# Patient Record
Sex: Female | Born: 2000 | Race: Black or African American | Hispanic: No | Marital: Single | Smoking: Former smoker
Health system: Southern US, Community
[De-identification: ages and names within clinical notes are randomized; demographics above are authoritative.]

## PROBLEM LIST (undated history)

## (undated) DIAGNOSIS — F419 Anxiety disorder, unspecified: Secondary | ICD-10-CM

## (undated) DIAGNOSIS — M5126 Other intervertebral disc displacement, lumbar region: Secondary | ICD-10-CM

## (undated) DIAGNOSIS — I10 Essential (primary) hypertension: Secondary | ICD-10-CM

## (undated) HISTORY — PX: MOUTH SURGERY: SHX715

## (undated) HISTORY — PX: NO PAST SURGERIES: SHX2092

## (undated) HISTORY — DX: Essential (primary) hypertension: I10

## (undated) HISTORY — DX: Anxiety disorder, unspecified: F41.9

---
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
## 2019-05-27 ENCOUNTER — Ambulatory Visit: Payer: Managed Care, Other (non HMO) | Attending: Internal Medicine

## 2019-05-27 DIAGNOSIS — Z20822 Contact with and (suspected) exposure to covid-19: Secondary | ICD-10-CM

## 2019-05-29 LAB — NOVEL CORONAVIRUS, NAA: SARS-CoV-2, NAA: NOT DETECTED

## 2019-05-31 DIAGNOSIS — A6000 Herpesviral infection of urogenital system, unspecified: Secondary | ICD-10-CM

## 2019-05-31 HISTORY — DX: Herpesviral infection of urogenital system, unspecified: A60.00

## 2019-06-11 ENCOUNTER — Ambulatory Visit: Payer: Managed Care, Other (non HMO) | Attending: Internal Medicine

## 2019-06-11 DIAGNOSIS — Z20822 Contact with and (suspected) exposure to covid-19: Secondary | ICD-10-CM

## 2019-06-12 ENCOUNTER — Telehealth: Payer: Self-pay

## 2019-06-12 NOTE — Telephone Encounter (Signed)
Call placed to pt.  Advised that an incorrect order was placed for her COVID test when she was to the [facility] on 1/12. (an order was incorrectly placed for "MODY Genetic Profile")  The patient stated she has to show that she has a negative COVID test, before she can return to campus on Saturday, and voiced concern about getting results in time.  Advised will contact [facility] to inquire if a correct order can be placed, to prevent the pt. from having to retest.   Phone call to [employer].  Spoke with Lupita Leash. Was advised that the correct order can be entered by [facility], and the specimen can be processed.  Advised that the pt. Will not need to retest.  Per [employer], an authorization form will be sent for a provider to sign, which will allow [facility] to finalize the specimen and release results.    Phone call to Dr. Ria Clock.  She stated she is willing to sign the authorization form, to prevent the patient from having to retest.  Advised Dr. Dayton Scrape that the form will be emailed to her.  Dr. Dayton Scrape agreed with plan.  Phone call to pt.  Advised she will not need to retest.  Advised of plan to have the correct order placed and to proceed with processing her COVID specimen.  Pt. Verb. Understanding.

## 2019-06-12 NOTE — Telephone Encounter (Signed)
Pt called and stated that she has results in her my chart that are not correct. Pt went for a covid test on 06/11/19. Pt states that she would like a call back regarding. Should pt go and get another test or can [employer] fix this. Please advise

## 2019-06-13 ENCOUNTER — Other Ambulatory Visit: Payer: Managed Care, Other (non HMO)

## 2019-06-13 LAB — MATURITY ONSET DIABETES OF THE YOUNG(MODY)GENETIC PROFILE

## 2019-06-13 LAB — SPECIMEN STATUS REPORT

## 2019-06-13 LAB — NOVEL CORONAVIRUS, NAA: SARS-CoV-2, NAA: NOT DETECTED

## 2019-10-07 ENCOUNTER — Ambulatory Visit
Admission: EM | Admit: 2019-10-07 | Discharge: 2019-10-07 | Disposition: A | Discharge status: Home / Self Care | Payer: Managed Care, Other (non HMO) | Attending: Family Medicine | Admitting: Family Medicine

## 2019-10-07 ENCOUNTER — Other Ambulatory Visit: Payer: Self-pay

## 2019-10-07 ENCOUNTER — Encounter: Payer: Self-pay | Admitting: Emergency Medicine

## 2019-10-07 DIAGNOSIS — Z789 Other specified health status: Secondary | ICD-10-CM

## 2019-10-07 DIAGNOSIS — R238 Other skin changes: Secondary | ICD-10-CM

## 2019-10-07 MED ORDER — MUPIROCIN 2 % EX OINT: TOPICAL_OINTMENT | CUTANEOUS | 0 refills | Status: DC

## 2019-10-07 MED ORDER — SULFAMETHOXAZOLE-TRIMETHOPRIM 800-160 MG PO TABS: 1.0000 | ORAL_TABLET | Freq: Two times a day (BID) | ORAL | 0 refills | Status: AC

## 2019-10-07 NOTE — Discharge Instructions (Signed)
Keep clean as discussed.  Monitor.  Use topical ointment.  If you notice any redness, swelling, pain or continued irritation begin oral antibiotic.  Follow up with your primary care physician this week as needed. Return to Urgent care for new or worsening concerns.

## 2019-10-07 NOTE — ED Provider Notes (Signed)
[facility] ____________________________________________  Time seen: Approximately 9:09 AM  I have reviewed the triage vital signs and the nursing notes.   HISTORY  Chief Complaint Skin Problem  HPI Kathy Moran is a 19 y.o. female presenting for evaluation of possible infection to bellybutton piercing.  Patient has had piercing present since August 2020.  Patient states last few days she noticed some irritation at the top piercing hole and concerned that potentially may be infected.  Denies pain.  Denies any injury or snagging it.  Tetanus immunizations up-to-date.  Denies fevers, abdominal pain, dysuria, back pain.  Denies pregnancy.  Reports otherwise doing well.   Past Medical History:  Diagnosis Date  . Lumbar herniated disc     There are no problems to display for this patient.   Past Surgical History:  Procedure Laterality Date  . NO PAST SURGERIES       No current facility-administered medications for this encounter.  Current Outpatient Medications:  .  TRI-PREVIFEM 0.18/0.215/0.25 MG-35 MCG tablet, TAKE 1 TAB BY MOUTH ONCE A DAY FOR 28 DAYS MAX. DAILY DOSE: 1 TAB, Disp: , Rfl: 5 .  mupirocin ointment (BACTROBAN) 2 %, Apply two times a day for 7 days., Disp: 22 g, Rfl: 0 .  sulfamethoxazole-trimethoprim (BACTRIM DS) 800-160 MG tablet, Take 1 tablet by mouth 2 (two) times daily for 7 days., Disp: 14 tablet, Rfl: 0  Allergies Shrimp [shellfish allergy]  Family History  Problem Relation Age of Onset  . Healthy Mother   . Healthy Father     Social History Social History   Tobacco Use  . Smoking status: Never Smoker  . Smokeless tobacco: Never Used  Substance Use Topics  . Alcohol use: Yes  . Drug use: Never    Review of Systems Constitutional: No fever Cardiovascular: Denies chest pain. Respiratory: Denies shortness of breath. Gastrointestinal: No abdominal pain.  No nausea, no vomiting.  No diarrhea.   Genitourinary: Negative for  dysuria. Musculoskeletal: Negative for back pain. Skin: Positive skin changes.  ____________________________________________   PHYSICAL EXAM:  VITAL SIGNS: ED Triage Vitals  Enc Vitals Group     BP 10/07/19 0900 131/87     Pulse Rate 10/07/19 0900 99     Resp 10/07/19 0900 18     Temp 10/07/19 0900 98.2 F (36.8 C)     Temp Source 10/07/19 0900 Oral     SpO2 10/07/19 0900 100 %     Weight 10/07/19 0857 143 lb 1.3 oz (64.9 kg)     Height 10/07/19 0857 5\' 6"  (1.676 m)     Head Circumference --      Peak Flow --      Pain Score 10/07/19 0857 1     Pain Loc --      Pain Edu? --      Excl. in [place]? --     Constitutional: Alert and oriented. Well appearing and in no acute distress. Eyes: Conjunctivae are normal.  ENT      Head: Normocephalic and atraumatic. Respiratory: Normal respiratory effort without tachypnea nor retractions. Gastrointestinal: Soft and nontender.  No CVA tenderness. Musculoskeletal: Steady gait.  Neurologic:  Normal speech and language. Speech is normal. No gait instability.  Skin:  Skin is warm, dry.  Except: Navel piercing present with with piercing still present, along the top of the piercing minimal dried blood with minimal purulence, no active drainage, no erythema surrounding, no induration, nontender. Psychiatric: Mood and affect are normal. Speech and behavior are normal.  Patient exhibits appropriate insight and judgment   ___________________________________________   LABS (all labs ordered are listed, but only abnormal results are displayed)  Labs Reviewed - No data to display ____________________________________________   PROCEDURES Procedures   INITIAL IMPRESSION / ASSESSMENT AND PLAN / ED COURSE  Pertinent labs & imaging results that were available during my care of the patient were reviewed by me and considered in my medical decision making (see chart for details).  Well-appearing patient.  Bellybutton piercing appears more  irritated, concern for an beginning infection.  Will treat with topical Bactroban.  Hardcopy Bactrim given to begin in the next 2 to 3 days if any redness begins or if does not resolve.  Discussed very strict follow-up and return parameters.  Counseled cleaning.Discussed indication, risks and benefits of medications with patient.   Discussed follow up with Primary care physician this week as needed. Discussed follow up and return parameters including no resolution or any worsening concerns. Patient verbalized understanding and agreed to plan.   ____________________________________________   FINAL CLINICAL IMPRESSION(S) / ED DIAGNOSES  Final diagnoses:  Skin irritation  Body piercing     ED Discharge Orders         Ordered    mupirocin ointment (BACTROBAN) 2 %     10/07/19 0913    sulfamethoxazole-trimethoprim (BACTRIM DS) 800-160 MG tablet  2 times daily     10/07/19 0913           Note: This dictation was prepared with Dragon dictation along with smaller phrase technology. Any transcriptional errors that result from this process are unintentional.         Renford Dills, NP 10/07/19 1038

## 2019-10-07 NOTE — ED Triage Notes (Signed)
Pt c/o belly button pain. She states that her piercing is infected. She had it pierced in August 2020. noticed the pain and discharge from the area last night.

## 2020-06-02 ENCOUNTER — Encounter: Payer: Managed Care, Other (non HMO) | Admitting: Certified Nurse Midwife

## 2020-07-13 ENCOUNTER — Other Ambulatory Visit: Payer: Self-pay

## 2020-07-13 ENCOUNTER — Encounter: Payer: Self-pay | Admitting: Certified Nurse Midwife

## 2020-07-13 ENCOUNTER — Ambulatory Visit (INDEPENDENT_AMBULATORY_CARE_PROVIDER_SITE_OTHER): Payer: Managed Care, Other (non HMO) | Admitting: Certified Nurse Midwife

## 2020-07-13 VITALS — BP 125/96 | HR 112 | Resp 16 | Ht 66.0 in | Wt 146.1 lb

## 2020-07-13 DIAGNOSIS — Z01419 Encounter for gynecological examination (general) (routine) without abnormal findings: Secondary | ICD-10-CM | POA: Diagnosis not present

## 2020-07-13 NOTE — Patient Instructions (Signed)
Preventive Care 21-21 Years Old, Female Preventive care refers to lifestyle choices and visits with your health care provider that can promote health and wellness. This includes:  A yearly physical exam. This is also called an annual wellness visit.  Regular dental and eye exams.  Immunizations.  Screening for certain conditions.  Healthy lifestyle choices, such as: ? Eating a healthy diet. ? Getting regular exercise. ? Not using drugs or products that contain nicotine and tobacco. ? Limiting alcohol use. What can I expect for my preventive care visit? Physical exam Your health care provider may check your:  Height and weight. These may be used to calculate your BMI (body mass index). BMI is a measurement that tells if you are at a healthy weight.  Heart rate and blood pressure.  Body temperature.  Skin for abnormal spots. Counseling Your health care provider may ask you questions about your:  Past medical problems.  Family's medical history.  Alcohol, tobacco, and drug use.  Emotional well-being.  Home life and relationship well-being.  Sexual activity.  Diet, exercise, and sleep habits.  Work and work environment.  Access to firearms.  Method of birth control.  Menstrual cycle.  Pregnancy history. What immunizations do I need? Vaccines are usually given at various ages, according to a schedule. Your health care provider will recommend vaccines for you based on your age, medical history, and lifestyle or other factors, such as travel or where you work.   What tests do I need? Blood tests  Lipid and cholesterol levels. These may be checked every 5 years starting at age 20.  Hepatitis C test.  Hepatitis B test. Screening  Diabetes screening. This is done by checking your blood sugar (glucose) after you have not eaten for a while (fasting).  STD (sexually transmitted disease) testing, if you are at risk.  BRCA-related cancer screening. This may be  done if you have a family history of breast, ovarian, tubal, or peritoneal cancers.  Pelvic exam and Pap test. This may be done every 3 years starting at age 21. Starting at age 30, this may be done every 5 years if you have a Pap test in combination with an HPV test. Talk with your health care provider about your test results, treatment options, and if necessary, the need for more tests.   Follow these instructions at home: Eating and drinking  Eat a healthy diet that includes fresh fruits and vegetables, whole grains, lean protein, and low-fat dairy products.  Take vitamin and mineral supplements as recommended by your health care provider.  Do not drink alcohol if: ? Your health care provider tells you not to drink. ? You are pregnant, may be pregnant, or are planning to become pregnant.  If you drink alcohol: ? Limit how much you have to 0-1 drink a day. ? Be aware of how much alcohol is in your drink. In the [place] one drink equals one 12 oz bottle of beer (355 mL), one 5 oz glass of wine (148 mL), or one 1 oz glass of hard liquor (44 mL).   Lifestyle  Take daily care of your teeth and gums. Brush your teeth every morning and night with fluoride toothpaste. Floss one time each day.  Stay active. Exercise for at least 30 minutes 5 or more days each week.  Do not use any products that contain nicotine or tobacco, such as cigarettes, e-cigarettes, and chewing tobacco. If you need help quitting, ask your health care provider.  Do not   use drugs.  If you are sexually active, practice safe sex. Use a condom or other form of protection to prevent STIs (sexually transmitted infections).  If you do not wish to become pregnant, use a form of birth control. If you plan to become pregnant, see your health care provider for a prepregnancy visit.  Find healthy ways to cope with stress, such as: ? Meditation, yoga, or listening to music. ? Journaling. ? Talking to a trusted  person. ? Spending time with friends and family. Safety  Always wear your seat belt while driving or riding in a vehicle.  Do not drive: ? If you have been drinking alcohol. Do not ride with someone who has been drinking. ? When you are tired or distracted. ? While texting.  Wear a helmet and other protective equipment during sports activities.  If you have firearms in your house, make sure you follow all gun safety procedures.  Seek help if you have been physically or sexually abused. What's next?  Go to your health care provider once a year for an annual wellness visit.  Ask your health care provider how often you should have your eyes and teeth checked.  Stay up to date on all vaccines. This information is not intended to replace advice given to you by your health care provider. Make sure you discuss any questions you have with your health care provider. Document Revised: 01/12/2020 Document Reviewed: 01/25/2018 Elsevier Patient Education  2021 Elsevier Inc.  

## 2020-07-13 NOTE — Progress Notes (Addendum)
GYNECOLOGY ANNUAL PREVENTATIVE CARE ENCOUNTER NOTE  History:     Kathy Moran is a 20 y.o. No obstetric history on file. female here for a routine annual gynecologic exam.  Current complaints: none. Was told by her pediatrician that she needs to start seeing GYN.   Denies abnormal vaginal bleeding, discharge, pelvic pain, problems with intercourse or other gynecologic concerns.     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 Contraception: OCP (estrogen/progesterone) Last Pap: not due until 20 Last mammogram: n/a   The pregnancy intention screening data noted above was reviewed. Potential methods of contraception were discussed. The patient elected to proceed with Oral Contraceptive.    Obstetric History OB History  No obstetric history on file.    Past Medical History:  Diagnosis Date  . Lumbar herniated disc   . HSV   Past Surgical History:  Procedure Laterality Date  . NO PAST SURGERIES      Current Outpatient Medications on File Prior to Visit  Medication Sig Dispense Refill  . levonorgestrel-ethinyl estradiol (ALESSE) 0.1-20 MG-MCG tablet Take 1 tablet by mouth daily.    . mupirocin ointment (BACTROBAN) 2 % Apply two times a day for 7 days. 22 g 0  . TRI-PREVIFEM 0.18/0.215/0.25 MG-35 MCG tablet TAKE 1 TAB BY MOUTH ONCE A DAY FOR 28 DAYS MAX. DAILY DOSE: 1 TAB  5   No current facility-administered medications on file prior to visit.    Allergies  Allergen Reactions  . Shrimp [Shellfish Allergy]     Social History:  reports that she has never smoked. She has never used smokeless tobacco. She reports current alcohol use. She reports that she does not use drugs.  Family History  Problem Relation Age of Onset  . Healthy Mother   . Healthy Father     The  following portions of the patient's history were reviewed and updated as appropriate: allergies, current medications, past family history, past medical history, past social history, past surgical history and problem list.  Review of Systems Pertinent items noted in HPI and remainder of comprehensive ROS otherwise negative.  Physical Exam:  BP (!) 137/101   Pulse (!) 112   Resp 16   Ht 5\' 6"  (1.676 m)   Wt 146 lb 1.6 oz (66.3 kg)   BMI 23.58 kg/m  CONSTITUTIONAL: Well-developed, well-nourished female in no acute distress.  HENT:  Normocephalic, atraumatic, External right and left ear normal. Oropharynx is clear and moist EYES: Conjunctivae and EOM are normal. Pupils are equal, round, and reactive to light. No scleral icterus.  NECK: Normal range of motion, supple, no masses.  Normal thyroid.  SKIN: Skin is warm and dry. No rash noted. Not diaphoretic. No erythema. No pallor. MUSCULOSKELETAL: Normal range of motion. No tenderness.  No cyanosis, clubbing, or edema.  2+ distal pulses. NEUROLOGIC: Alert and oriented to person, place, and time. Normal reflexes, muscle tone coordination.  PSYCHIATRIC: Normal mood and affect. Normal behavior. Normal judgment and thought content. CARDIOVASCULAR: Normal heart rate noted, regular rhythm RESPIRATORY: Clear to auscultation bilaterally. Effort and breath sounds normal, no problems with respiration noted. BREASTS: declines  ABDOMEN: Soft, no distention noted.  No tenderness, rebound or guarding.  PELVIC: Not indicated .   Assessment and Plan:  Annual [facility] Women GYN  due next year at annual ( pt given option to do today  or wait until next year given that she turns 20 in August).  Mammogram :n/a  Labs:n/a  Refills:none Referral:none Encouraged safe sex. Discussed HSV and shedding of virus, prevention of spread. Pt state she has had STD testing in the past.  BP elevated, pt state she has always had elevated BP's and that it runs in her  family , rpt 125/96. Encouraged pt to discussed with PCP. She verbalizes and agrees to plan of care.  Routine preventative health maintenance measures emphasized. Please refer to After Visit Summary for other counseling recommendations.      Doreene Burke, CNM [facility],  [facility]

## 2020-09-01 ENCOUNTER — Ambulatory Visit (INDEPENDENT_AMBULATORY_CARE_PROVIDER_SITE_OTHER): Payer: Managed Care, Other (non HMO)

## 2020-09-01 ENCOUNTER — Other Ambulatory Visit: Payer: Self-pay

## 2020-09-01 ENCOUNTER — Ambulatory Visit (HOSPITAL_COMMUNITY)
Admission: EM | Admit: 2020-09-01 | Discharge: 2020-09-01 | Disposition: A | Discharge status: Home / Self Care | Payer: Managed Care, Other (non HMO) | Attending: Emergency Medicine | Admitting: Emergency Medicine

## 2020-09-01 ENCOUNTER — Encounter (HOSPITAL_COMMUNITY): Payer: Self-pay

## 2020-09-01 DIAGNOSIS — M545 Low back pain, unspecified: Secondary | ICD-10-CM | POA: Diagnosis not present

## 2020-09-01 DIAGNOSIS — M546 Pain in thoracic spine: Secondary | ICD-10-CM

## 2020-09-01 DIAGNOSIS — M4186 Other forms of scoliosis, lumbar region: Secondary | ICD-10-CM | POA: Diagnosis not present

## 2020-09-01 MED ORDER — NAPROXEN 500 MG PO TABS: 500.0000 mg | ORAL_TABLET | Freq: Two times a day (BID) | ORAL | 0 refills | Status: DC

## 2020-09-01 MED ORDER — CYCLOBENZAPRINE HCL 10 MG PO TABS: 10.0000 mg | ORAL_TABLET | Freq: Every day | ORAL | 0 refills | Status: DC

## 2020-09-01 NOTE — Discharge Instructions (Signed)
Take naproxen twice a day for the next 5 days then as needed  Take muscle relaxer at bedtime as needed to help you rest, if making you too drowsy, take half a pill  Follow up with orthopedics if pain still persisting after 1 week

## 2020-09-01 NOTE — ED Triage Notes (Signed)
Pt c/o mid back pain x 4 days. The pt states the pain moves all around the rest of her back. Pt states she has taken Tizanidine and has not had relief.

## 2020-09-01 NOTE — ED Provider Notes (Signed)
[facility]    CSN: [id number] Arrival date & time: 09/01/20  1101      History   Chief Complaint Chief Complaint  Patient presents with  . Back Pain    HPI Kathy Moran is a 20 y.o. female.   Patient presents with left sided mid back pain radiating into middle of back starting 4 days ago. Worsened with twisting, turning, bending, standing and walking. Interfering with sleep. Denies numbness, tingling, urinary changes, precipitating event. Attempted use of ibuprofen and zanaflex with relief. History of herniated disk in the lumbar region, last flair up 2019.   Past Medical History:  Diagnosis Date  . Lumbar herniated disc     There are no problems to display for this patient.   Past Surgical History:  Procedure Laterality Date  . NO PAST SURGERIES      OB History   No obstetric history on file.      Home Medications    Prior to Admission medications   Medication Sig Start Date End Date Taking? Authorizing Provider  levonorgestrel-ethinyl estradiol (ALESSE) 0.1-20 MG-MCG tablet Take 1 tablet by mouth daily.    [provider]  mupirocin ointment (BACTROBAN) 2 % Apply two times a day for 7 days. 10/07/19   Renford Dills, NP  TRI-PREVIFEM 0.18/0.215/0.25 MG-35 MCG tablet TAKE 1 TAB BY MOUTH ONCE A DAY FOR 28 DAYS MAX. DAILY DOSE: 1 TAB 10/30/17   [provider]    Family History Family History  Problem Relation Age of Onset  . Healthy Mother   . Healthy Father     Social History Social History   Tobacco Use  . Smoking status: Never Smoker  . Smokeless tobacco: Never Used  Vaping Use  . Vaping Use: Every day  Substance Use Topics  . Alcohol use: Yes  . Drug use: Never     Allergies   Shrimp [shellfish allergy]   Review of Systems Review of Systems  Constitutional: Negative.   Respiratory: Negative.   Cardiovascular: Negative.   Musculoskeletal: Positive for back pain. Negative for arthralgias, gait problem,  joint swelling, myalgias, neck pain and neck stiffness.  Skin: Negative.   Neurological: Negative.      Physical Exam Triage Vital Signs ED Triage Vitals  Enc Vitals Group     BP 09/01/20 1213 (!) 142/89     Pulse Rate 09/01/20 1213 98     Resp 09/01/20 1213 18     Temp 09/01/20 1213 98.7 F (37.1 C)     Temp Source 09/01/20 1213 Oral     SpO2 09/01/20 1213 100 %     Weight --      Height --      Head Circumference --      Peak Flow --      Pain Score 09/01/20 1211 6     Pain Loc --      Pain Edu? --      Excl. in [place]? --    No data found.  Updated Vital Signs BP (!) 142/89 (BP Location: Right Arm)   Pulse 98   Temp 98.7 F (37.1 C) (Oral)   Resp 18   LMP 08/06/2020 (Exact Date)   SpO2 100%   Visual Acuity Right Eye Distance:   Left Eye Distance:   Bilateral Distance:    Right Eye Near:   Left Eye Near:    Bilateral Near:     Physical Exam Constitutional:      Appearance: Normal appearance.  She is normal weight.  HENT:     Head: Normocephalic.  Eyes:     Extraocular Movements: Extraocular movements intact.  Pulmonary:     Effort: Pulmonary effort is normal.  Musculoskeletal:       Arms:     Cervical back: Normal and normal range of motion.     Thoracic back: No swelling, spasms or tenderness.     Lumbar back: Normal.       Back:     Comments: Unable to reproduce back pain, no tenderness over thoracic over lumbar region  Skin:    General: Skin is warm and dry.  Neurological:     General: No focal deficit present.     Mental Status: She is alert and oriented to person, place, and time. Mental status is at baseline.  Psychiatric:        Mood and Affect: Mood normal.        Behavior: Behavior normal.        Thought Content: Thought content normal.        Judgment: Judgment normal.      UC Treatments / Results  Labs (all labs ordered are listed, but only abnormal results are displayed) Labs Reviewed - No data to  display  EKG   Radiology No results found.  Procedures Procedures (including critical care time)  Medications Ordered in UC Medications - No data to display  Initial Impression / Assessment and Plan / UC Course  I have reviewed the triage vital signs and the nursing notes.  Pertinent labs & imaging results that were available during my care of the patient were reviewed by me and considered in my medical decision making (see chart for details).   1. Xray thoracic-negative 2.xray lumbar- levoscoliosis 3. Naproxen 500 mg bid 4. Flexeril 10 mg at bedtime prn 5. Follow up with ortho in 1 week if pain persist, given resources   Final Clinical Impressions(s) / UC Diagnoses   Final diagnoses:  None   Discharge Instructions   None    ED Prescriptions    None     PDMP not reviewed this encounter.   Valinda Hoar, NP 09/01/20 1409

## 2021-04-07 ENCOUNTER — Encounter: Payer: Managed Care, Other (non HMO) | Admitting: Certified Nurse Midwife

## 2021-04-07 ENCOUNTER — Telehealth: Payer: Self-pay | Admitting: Certified Nurse Midwife

## 2021-04-07 ENCOUNTER — Other Ambulatory Visit: Payer: Self-pay

## 2021-04-07 MED ORDER — LEVONORGESTREL-ETHINYL ESTRAD 0.1-20 MG-MCG PO TABS: 1.0000 | ORAL_TABLET | Freq: Every day | ORAL | 0 refills | Status: DC

## 2021-04-07 NOTE — Telephone Encounter (Signed)
Pt came in and stated that she had a sore throat and had been coughing since Saturday and wanted to see if she could get her birth control since see will be out within the next few days.  Pt would like to have a call back to let her know if this can be done.

## 2021-04-07 NOTE — Telephone Encounter (Signed)
Rx sent, pt aware 

## 2021-05-05 ENCOUNTER — Other Ambulatory Visit (HOSPITAL_COMMUNITY)
Admission: RE | Admit: 2021-05-05 | Discharge: 2021-05-05 | Disposition: A | Discharge status: Home / Self Care | Payer: Managed Care, Other (non HMO) | Source: Ambulatory Visit | Attending: Certified Nurse Midwife | Admitting: Certified Nurse Midwife

## 2021-05-05 ENCOUNTER — Encounter: Payer: Self-pay | Admitting: Certified Nurse Midwife

## 2021-05-05 ENCOUNTER — Ambulatory Visit (INDEPENDENT_AMBULATORY_CARE_PROVIDER_SITE_OTHER): Payer: Managed Care, Other (non HMO) | Admitting: Certified Nurse Midwife

## 2021-05-05 ENCOUNTER — Encounter: Payer: Managed Care, Other (non HMO) | Admitting: Certified Nurse Midwife

## 2021-05-05 ENCOUNTER — Other Ambulatory Visit: Payer: Self-pay

## 2021-05-05 VITALS — BP 119/81 | HR 116 | Ht 66.0 in | Wt 164.6 lb

## 2021-05-05 DIAGNOSIS — Z01419 Encounter for gynecological examination (general) (routine) without abnormal findings: Secondary | ICD-10-CM | POA: Diagnosis not present

## 2021-05-05 DIAGNOSIS — Z124 Encounter for screening for malignant neoplasm of cervix: Secondary | ICD-10-CM | POA: Insufficient documentation

## 2021-05-05 NOTE — Patient Instructions (Signed)
Preventing Cervical Cancer Cervical cancer is cancer that grows on the cervix. The cervix is at the bottom of the uterus. It connects the uterus to the vagina. The uterus is where a baby develops during pregnancy. Cancer occurs when cells become abnormal and start to grow out of control. If cervical cancer is not found early, it can spread and become dangerous. Cervical cancer cannot always be prevented, but you can take steps to lower your risk of developing this condition. How can this condition affect me? Cervical cancer grows slowly and may not cause any symptoms at first. Over time, the cancer can grow deep into the cervix tissue and spread to other areas. This may take years, and it may happen without you knowing about it. If it is found early, cervical cancer can be treated effectively. If the cancer has grown deep into your cervix or has spread, it will be more difficult to treat. Most cases of cervical cancer are caused by an STI (sexually transmitted infection) called human papillomavirus (HPV). One way to reduce your risk of cervical cancer is to take steps to avoid infection with the HPV virus. Getting regular Pap tests is also important because this can help identify changes in cells that could lead to cancer. Your chances of getting this disease can also be reduced by making certain lifestyle changes. What can increase my risk? You are more likely to develop this condition if: You have certain things in your sexual history, such as: Having a sexually transmitted viral infection. These include chlamydia and herpes. Having more than one sexual partner, or having sex with someone who has more than one sexual partner. Not using condoms during sex. Having been sexually active before the age of 18. Your mother took a medicine called diethylstilbestrol (DES) while pregnant with you, causing you to be exposed to this medicine before birth. Your mother or sister has had cervical cancer. You are  between the ages of 40-50. You have or have had certain other medical conditions, such as: Previous cancer of the vagina or vulva. A weakened body defense system (immune system). A history of dysplasia of the cervix. You use oral contraceptives, also called birth control pills. You smoke or breathe in secondhand smoke. What actions can I take to prevent cervical cancer? Preventing HPV infection  Ask your health care provider about getting the HPV vaccine. If you are 26 years old or younger, you may need to get this vaccine, which is given in three doses over 6 months. This vaccine protects against the types of HPV that could cause cancer. Limit the number of people you have sex with. Also avoid having sex with people who have had many sex partners. Use a latex condom every time you have sex. Getting Pap tests Get Pap tests regularly, starting at age 21. Talk with your health care provider about how often you need these tests. Having regular Pap tests will help identify changes in cells that could lead to cancer. Steps can then be taken to prevent cancer from developing. Most women who are 21?21 years of age should have a Pap test every 3 years. Most women who are 30?21 years of age should have a Pap test in combination with an HPV test every 5 years. Women with a higher risk of cervical cancer, such as those with a weakened immune system or those who were exposed to DES medicine before birth, may need more frequent testing. Making other lifestyle changes  Do not use any   products that contain nicotine or tobacco, such as cigarettes, e-cigarettes, and chewing tobacco. If you need help quitting, ask your health care provider. Eat a healthy diet that includes at least 5 servings of fruits and vegetables every day. Lose weight if you are overweight. Where to find support Talk with your health care provider, school nurse, or local health department for guidance about screening and  vaccination. Some children and teens may be able to get the HPV vaccine free of charge through the U.[place]. government's Vaccines for Children [facility]) program. Other places that provide vaccinations include: Public health clinics. Check with your local health department. Federally [employer], where you would pay only what you can afford. To find one near you, check this website: http://weiss.info/ [facility]. These are part of a program for Medicare and Medicaid patients who live in rural areas. The National Breast and Cervical Cancer Early Detection Program also provides breast and cervical cancer screenings and diagnostic services to low-income, uninsured, and underinsured women. Cervical cancer can be passed down through families. Talk with your health care provider or a genetic counselor to learn more about genetic testing for cancer. Where to find more information Learn more about cervical cancer from: [employer]: www.acog.org American Cancer Society: www.cancer.org Centers for Disease Control and Prevention: FootballExhibition.com.br Contact a health care provider if you have: Pelvic pain. Unusual discharge or bleeding from your vagina. Summary Cervical cancer is cancer that grows on the cervix. The cervix is at the bottom of the uterus. Ask your health care provider about getting the HPV vaccine. Be sure to get regular Pap tests as recommended by your health care provider. See your health care provider right away if you have any pelvic pain or unusual discharge or bleeding from your vagina. This information is not intended to replace advice given to you by your health care provider. Make sure you discuss any questions you have with your health care provider. Document Revised: 12/17/2018 Document Reviewed: 12/17/2018 Elsevier Patient Education  2022 [employer].

## 2021-05-05 NOTE — Progress Notes (Signed)
GYN ENCOUNTER NOTE  Subjective:       Kathy Moran is a 20 y.o. No obstetric history on file. female is here for gynecologic evaluation of the following issues:  1. Presents for pap sear only. Had annual earlier this year was not 20 yr old at the time and request to come back and have it done later.      Gynecologic History Patient's last menstrual period was 04/15/2021. Contraception: OCP (estrogen/progesterone) Last Pap: has not had ( 1st one today) Last mammogram: n/a.   Obstetric History OB History  No obstetric history on file.    Past Medical History:  Diagnosis Date   Lumbar herniated disc     Past Surgical History:  Procedure Laterality Date   NO PAST SURGERIES      Current Outpatient Medications on File Prior to Visit  Medication Sig Dispense Refill   levonorgestrel-ethinyl estradiol (ALESSE) 0.1-20 MG-MCG tablet Take 1 tablet by mouth daily. 28 tablet 0   naproxen (NAPROSYN) 500 MG tablet Take 1 tablet (500 mg total) by mouth 2 (two) times daily. 30 tablet 0   TRI-PREVIFEM 0.18/0.215/0.25 MG-35 MCG tablet TAKE 1 TAB BY MOUTH ONCE A DAY FOR 28 DAYS MAX. DAILY DOSE: 1 TAB  5   cyclobenzaprine (FLEXERIL) 10 MG tablet Take 1 tablet (10 mg total) by mouth at bedtime. (Patient not taking: Reported on 05/05/2021) 10 tablet 0   mupirocin ointment (BACTROBAN) 2 % Apply two times a day for 7 days. (Patient not taking: Reported on 05/05/2021) 22 g 0   No current facility-administered medications on file prior to visit.    Allergies  Allergen Reactions   Shrimp [Shellfish Allergy]     Social History   Socioeconomic History   Marital status: Single    Spouse name: Not on file   Number of children: Not on file   Years of education: Not on file   Highest education level: Not on file  Occupational History   Not on file  Tobacco Use   Smoking status: Never   Smokeless tobacco: Never  Vaping Use   Vaping Use: Every day  Substance and Sexual Activity   Alcohol  use: Yes   Drug use: Never   Sexual activity: Never  Other Topics Concern   Not on file  Social History Narrative   ** Merged History Encounter **       Social Determinants of Health   Financial Resource Strain: Not on file  Food Insecurity: Not on file  Transportation Needs: Not on file  Physical Activity: Not on file  Stress: Not on file  Social Connections: Not on file  Intimate Partner Violence: Not on file    Family History  Problem Relation Age of Onset   Healthy Mother    Healthy Father     The following portions of the patient's history were reviewed and updated as appropriate: allergies, current medications, past family history, past medical history, past social history, past surgical history and problem list.  Review of Systems Review of Systems - Negative except except as noted in HPI Review of Systems - General ROS: negative for - chills, fatigue, fever, hot flashes, malaise or night sweats Hematological and Lymphatic ROS: negative for - bleeding problems or swollen lymph nodes Gastrointestinal ROS: negative for - abdominal pain, blood in stools, change in bowel habits and nausea/vomiting Musculoskeletal ROS: negative for - joint pain, muscle pain or muscular weakness Genito-Urinary ROS: negative for - change in menstrual cycle, dysmenorrhea, dyspareunia, dysuria,  genital discharge, genital ulcers, hematuria, incontinence, irregular/heavy menses, nocturia or pelvic pain  Objective:   BP 119/81   Pulse (!) 116   Ht 5\' 6"  (1.676 m)   Wt 164 lb 9.6 oz (74.7 kg)   LMP 04/15/2021   BMI 26.57 kg/m  CONSTITUTIONAL: Well-developed, well-nourished female in no acute distress.  HENT:  Normocephalic, atraumatic.  NECK: Normal range of motion, supple, no masses.  Normal thyroid.  SKIN: Skin is warm and dry. No rash noted. Not diaphoretic. No erythema. No pallor. NEUROLGIC: Alert and oriented to person, place, and time. PSYCHIATRIC: Normal mood and affect. Normal  behavior. Normal judgment and thought content. CARDIOVASCULAR:Not Examined RESPIRATORY: Not Examined BREASTS: Not Examined ABDOMEN: Soft, non distended; Non tender.  No Organomegaly. PELVIC:  External Genitalia: Normal  BUS: Normal  Vagina: Normal, small amount normal discharge  Cervix: Normal, contact bleeding with pap  Uterus: Normal size, shape,consistency, mobile  Adnexa: Normal  RV: Normal   Bladder: Nontender MUSCULOSKELETAL: Normal range of motion. No tenderness.  No cyanosis, clubbing, or edema.     Assessment:   Pap smear   Plan:   Pt had question about birth control . Is having OCP managed at school. The changed to a lower dose estrogen pill due to Bps, pt state she has gained weight and wants to go back to the other pill. Reviewed contraindications of elevated Bps and estrogen birth control options. BP today normal. Pt encouraged to stay on lo estrin or change to progestin only method.   Will follow up with pap results. Return for annual or prn.   04/17/2021, CNM

## 2021-05-10 LAB — CYTOLOGY - PAP
Comment: NEGATIVE
Diagnosis: UNDETERMINED — AB
High risk HPV: NEGATIVE

## 2021-05-11 ENCOUNTER — Encounter: Payer: Self-pay | Admitting: Certified Nurse Midwife

## 2021-05-17 ENCOUNTER — Encounter: Payer: Self-pay | Admitting: Certified Nurse Midwife

## 2021-05-17 ENCOUNTER — Other Ambulatory Visit: Payer: Self-pay | Admitting: Certified Nurse Midwife

## 2021-05-20 ENCOUNTER — Telehealth: Payer: Self-pay | Admitting: Certified Nurse Midwife

## 2021-05-20 ENCOUNTER — Other Ambulatory Visit: Payer: Self-pay

## 2021-05-20 MED ORDER — TRI-PREVIFEM 0.18/0.215/0.25 MG-35 MCG PO TABS: ORAL_TABLET | ORAL | 0 refills | Status: DC

## 2021-05-20 NOTE — Telephone Encounter (Signed)
[facility]over refill sent.  °

## 2021-05-20 NOTE — Telephone Encounter (Signed)
Pt called stating that she needed to make an appointment for medication refill- birth control, pt was directed to get b/p check. Provided pt with next available opening, pt declined asked for Feb 14 th - scheduled pt on that day. Pt is requesting refill to get her to that apt. Pt stated she has one pack left of birth control currently. Please Advise.

## 2021-06-15 ENCOUNTER — Encounter: Payer: Self-pay | Admitting: Certified Nurse Midwife

## 2021-06-16 ENCOUNTER — Other Ambulatory Visit: Payer: Self-pay

## 2021-06-16 MED ORDER — TRI-PREVIFEM 0.18/0.215/0.25 MG-35 MCG PO TABS: ORAL_TABLET | ORAL | 0 refills | Status: DC

## 2021-07-02 ENCOUNTER — Other Ambulatory Visit: Payer: Self-pay | Admitting: Certified Nurse Midwife

## 2021-07-13 ENCOUNTER — Encounter: Payer: Managed Care, Other (non HMO) | Admitting: Certified Nurse Midwife

## 2021-07-19 ENCOUNTER — Other Ambulatory Visit: Payer: Self-pay

## 2021-07-19 ENCOUNTER — Encounter: Payer: Self-pay | Admitting: Certified Nurse Midwife

## 2021-07-19 ENCOUNTER — Ambulatory Visit (INDEPENDENT_AMBULATORY_CARE_PROVIDER_SITE_OTHER): Payer: Managed Care, Other (non HMO) | Admitting: Certified Nurse Midwife

## 2021-07-19 VITALS — BP 124/82 | HR 99 | Resp 16 | Ht 66.0 in | Wt 164.9 lb

## 2021-07-19 DIAGNOSIS — Z23 Encounter for immunization: Secondary | ICD-10-CM

## 2021-07-19 MED ORDER — NORGESTIM-ETH ESTRAD TRIPHASIC 0.18/0.215/0.25 MG-35 MCG PO TABS: 1.0000 | ORAL_TABLET | Freq: Every day | ORAL | 11 refills | Status: DC

## 2021-07-19 MED ORDER — TRI-ESTARYLLA 0.18/0.215/0.25 MG-35 MCG PO TABS: ORAL_TABLET | ORAL | 3 refills | Status: DC

## 2021-07-19 NOTE — Addendum Note (Signed)
Addended by: Mechele Claude on: 07/19/2021 03:48 PM   Modules accepted: Orders

## 2021-07-19 NOTE — Addendum Note (Signed)
Addended by: Mechele Claude on: 07/19/2021 03:39 PM   Modules accepted: Orders

## 2021-07-19 NOTE — Progress Notes (Signed)
GYNECOLOGY ANNUAL PREVENTATIVE CARE ENCOUNTER NOTE  History:     Kathy Moran is a 21 y.o. No obstetric history on file. female here for a routine annual gynecologic exam.  Current complaints: none.   Denies abnormal vaginal bleeding, discharge, pelvic pain, problems with intercourse or other gynecologic concerns.     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 Contraception: OCP (estrogen/progesterone) Last Pap: 05/05/2021. Results were: normal Last mammogram: n/a    Upstream - 07/19/21 1459       Pregnancy Intention Screening   Does the patient want to become pregnant in the next year? No    Does the patient's partner want to become pregnant in the next year? No    Would the patient like to discuss contraceptive options today? No      Contraception Wrap Up   Current Method Oral Contraceptive    End Method Oral Contraceptive    Contraception Counseling Provided No            The pregnancy intention screening data noted above was reviewed. Potential methods of contraception were discussed. The patient elected to proceed with Oral Contraceptive.   Obstetric History OB History  No obstetric history on file.    Past Medical History:  Diagnosis Date   Lumbar herniated disc     Past Surgical History:  Procedure Laterality Date   NO PAST SURGERIES      Current Outpatient Medications on File Prior to Visit  Medication Sig Dispense Refill   TRI-ESTARYLLA 0.18/0.215/0.25 MG-35 MCG tablet TAKE 1 TAB BY MOUTH ONCE A DAY FOR 28 DAYS MAX. DAILY DOSE: 1 TAB 28 tablet 0   No current facility-administered medications on file prior to visit.    Allergies  Allergen Reactions   Shrimp [Shellfish Allergy]     Social History:  reports that she has never smoked. She has never used smokeless tobacco.  She reports current alcohol use. She reports that she does not use drugs.  Family History  Problem Relation Age of Onset   Healthy Mother    Healthy Father     The following portions of the patient's history were reviewed and updated as appropriate: allergies, current medications, past family history, past medical history, past social history, past surgical history and problem list.  Review of Systems Pertinent items noted in HPI and remainder of comprehensive ROS otherwise negative.  Physical Exam:  BP 124/82    Pulse 99    Resp 16    Ht 5\' 6"  (1.676 m)    Wt 164 lb 14.4 oz (74.8 kg)    LMP 07/08/2021    BMI 26.62 kg/m  CONSTITUTIONAL: Well-developed, well-nourished female in no acute distress.  HENT:  Normocephalic, atraumatic, External right and left ear normal. Oropharynx is clear and moist EYES: Conjunctivae and EOM are normal. Pupils are equal, round, and reactive to light. No scleral icterus.  NECK: Normal range of motion, supple, no masses.  Normal thyroid.  SKIN: Skin is warm and dry. No rash noted. Not diaphoretic. No erythema. No pallor. MUSCULOSKELETAL: Normal range of motion. No tenderness.  No cyanosis, clubbing, or edema.  2+ distal pulses. NEUROLOGIC: Alert and oriented to person, place, and time. Normal reflexes, muscle tone coordination.  PSYCHIATRIC: Normal mood and affect. Normal behavior. Normal judgment and thought content. CARDIOVASCULAR: Normal heart rate noted, regular rhythm RESPIRATORY: Clear to auscultation  bilaterally. Effort and breath sounds normal, no problems with respiration noted. BREASTS: Symmetric in size. No masses, tenderness, skin changes, nipple drainage, or lymphadenopathy bilaterally.  ABDOMEN: Soft, no distention noted.  No tenderness, rebound or guarding.  PELVIC: not indicated pap not due   Assessment and Plan:  Annual Well Women Exam   Pap: not due Mammogram : n/a  Labs:declines Refills: OCP Tdap today  Referral: none Routine  preventative health maintenance measures emphasized. Please refer to After Visit Summary for other counseling recommendations.      Doreene Burke, CNM [facility],  [facility]

## 2021-07-28 ENCOUNTER — Other Ambulatory Visit: Payer: Self-pay | Admitting: Certified Nurse Midwife

## 2022-06-18 IMAGING — DX DG LUMBAR SPINE COMPLETE 4+V
5 series · 5 of 5 positions shown · non-contrast
Comparison: October 14, 2015 there are 5 non-rib-bearing lumbar type
vertebral bodies.

CLINICAL DATA: Low back pain

EXAM:
LUMBAR SPINE - COMPLETE 4+ VIEW

[l-spine ap]
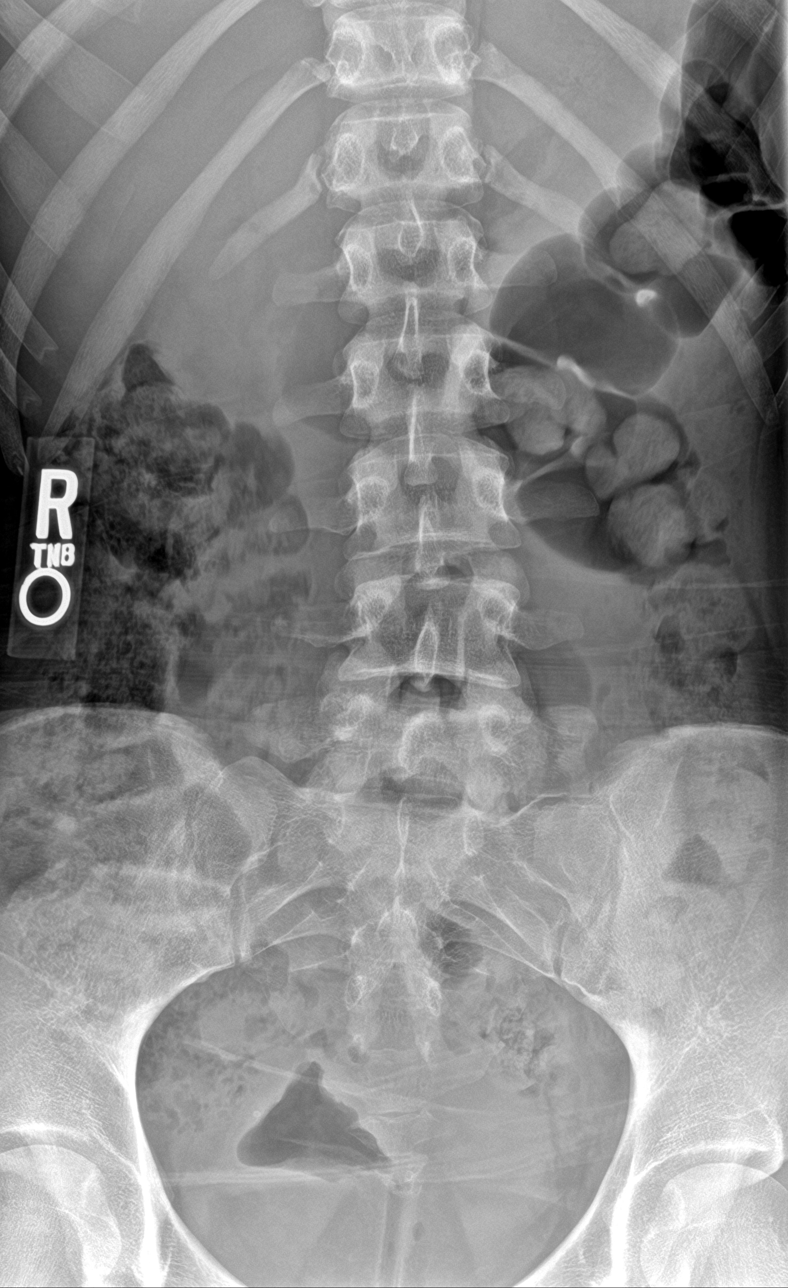

[l-spine obl (1 of 2)]
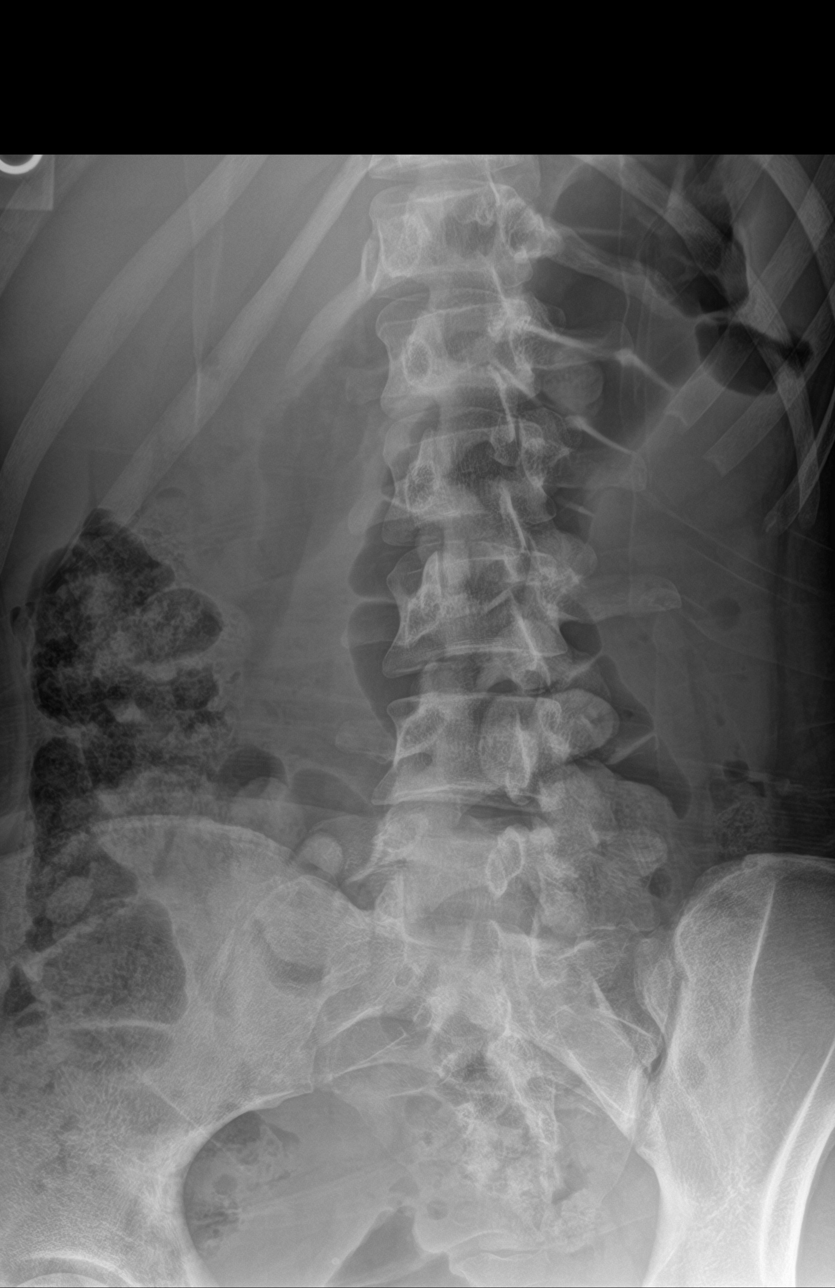

[l-spine obl (2 of 2)]
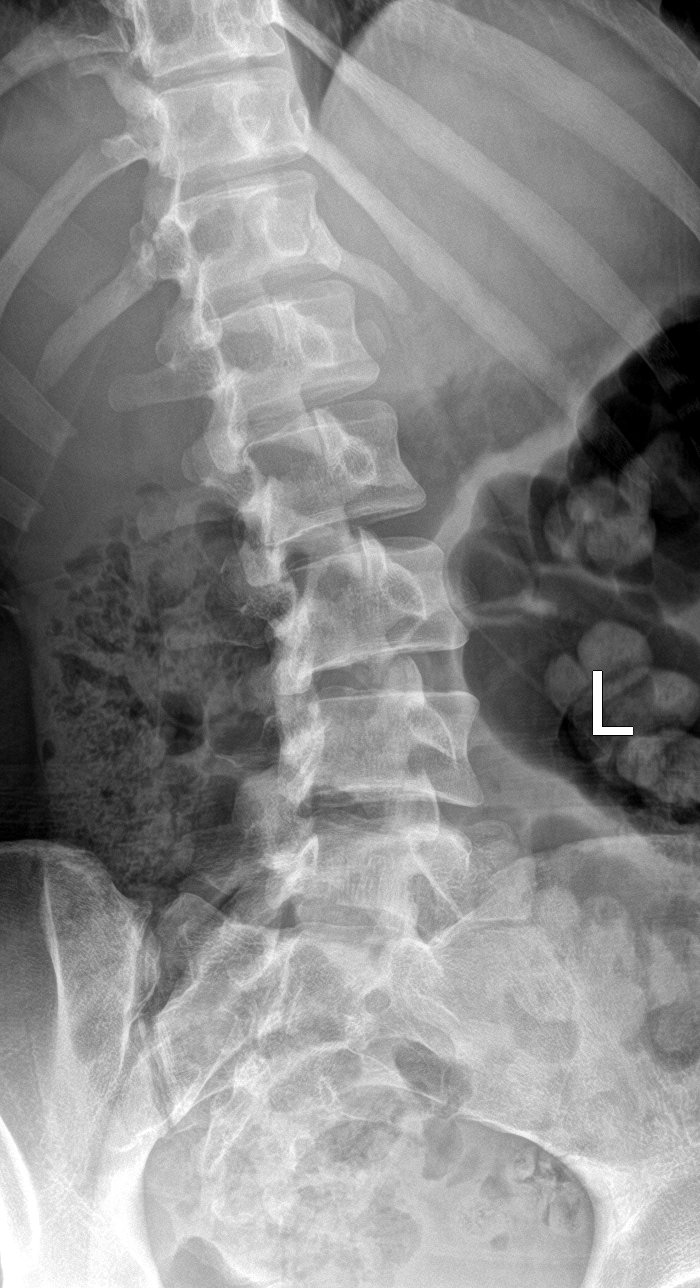

[l-spine lat]
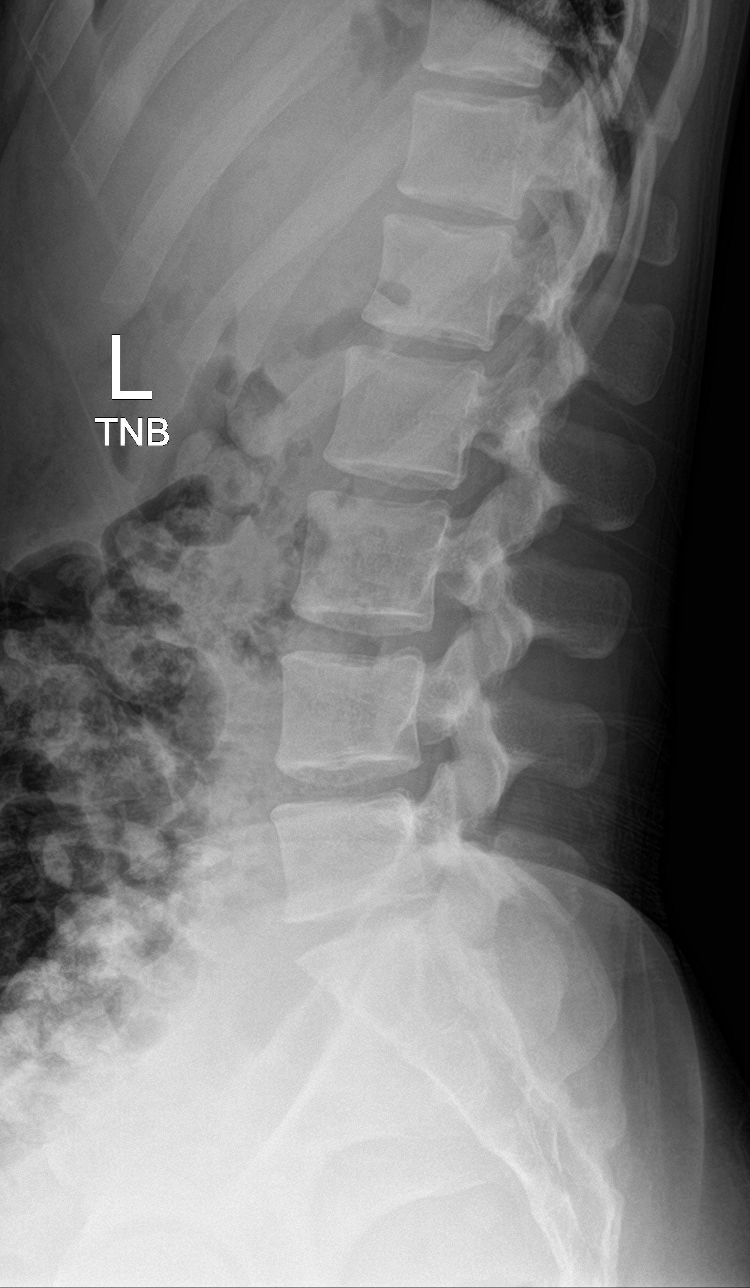

[l-spine spot]
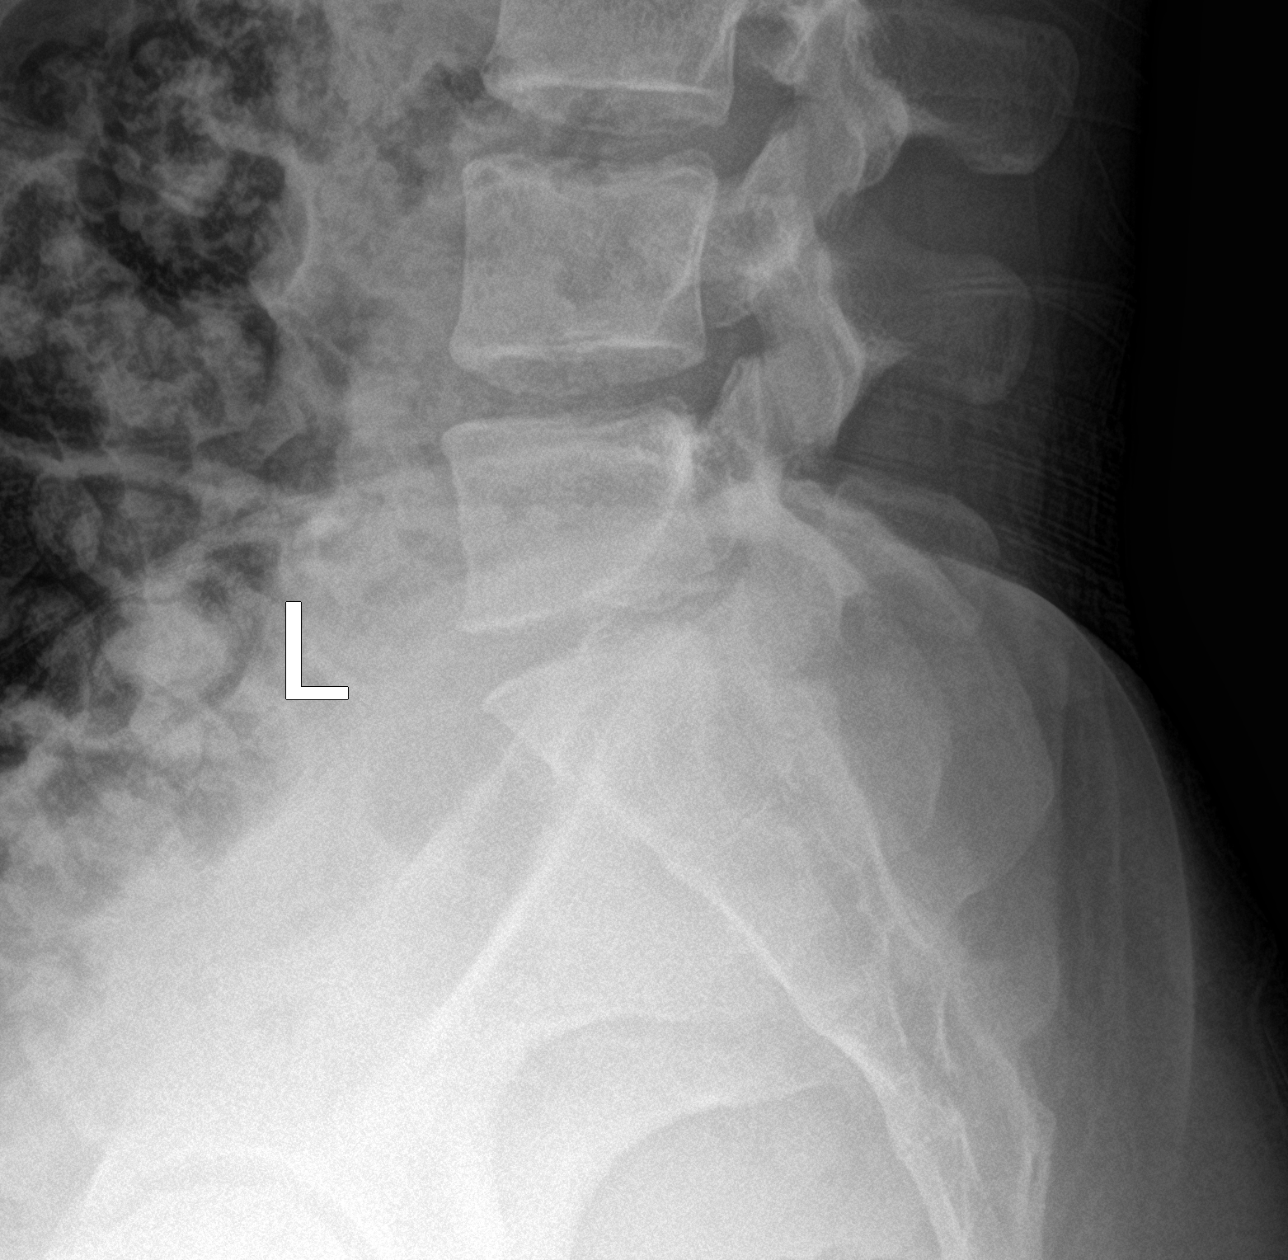

[5 of 5 positions shown; findings below may reference images not displayed]

FINDINGS: Frontal, lateral, spot lumbosacral lateral, and bilateral oblique
views were obtained. There are 4 strictly non-rib-bearing lumbar
type vertebral bodies. There is a transitional L5 vertebra with an
assimilation joint on the left. There is slight lower lumbar
levoscoliosis. There is no fracture or spondylolisthesis. The disc
spaces appear unremarkable. There is no appreciable facet
arthropathy.
IMPRESSION: Slight lower lumbar levoscoliosis. No fracture or spondylolisthesis.
No appreciable arthropathic change.

## 2022-08-02 ENCOUNTER — Other Ambulatory Visit: Payer: Self-pay | Admitting: Certified Nurse Midwife

## 2022-08-03 ENCOUNTER — Other Ambulatory Visit: Payer: Self-pay

## 2022-08-04 NOTE — Telephone Encounter (Signed)
Pt needs appointment

## 2022-08-08 ENCOUNTER — Other Ambulatory Visit: Payer: Self-pay | Admitting: Certified Nurse Midwife

## 2022-08-31 ENCOUNTER — Telehealth: Payer: Self-pay | Admitting: Certified Nurse Midwife

## 2022-08-31 ENCOUNTER — Telehealth: Payer: Self-pay

## 2022-08-31 MED ORDER — NORGESTIM-ETH ESTRAD TRIPHASIC 0.18/0.215/0.25 MG-35 MCG PO TABS: 1.0000 | ORAL_TABLET | Freq: Every day | ORAL | 2 refills | Status: DC

## 2022-08-31 NOTE — Telephone Encounter (Signed)
Please call patient and schedule an annual exam for her with Deneise Lever. Please let me know that appointment has been scheduled so I can send her birth control in.

## 2022-08-31 NOTE — Telephone Encounter (Signed)
Rx sent, pt aware 

## 2022-08-31 NOTE — Telephone Encounter (Signed)
The patient is needing a refill on her birthcontrol. The patient see Philip Aspen. Her schedule for May is not released. Could we please send refills for the patient to be about to call back to scheduled?

## 2022-10-10 ENCOUNTER — Ambulatory Visit
Admission: RE | Admit: 2022-10-10 | Discharge: 2022-10-10 | Disposition: A | Discharge status: Home / Self Care | Payer: Managed Care, Other (non HMO) | Source: Ambulatory Visit | Attending: Family Medicine | Admitting: Family Medicine

## 2022-10-10 VITALS — BP 156/109 | HR 109 | Temp 98.6°F | Resp 19

## 2022-10-10 DIAGNOSIS — B009 Herpesviral infection, unspecified: Secondary | ICD-10-CM

## 2022-10-10 MED ORDER — VALACYCLOVIR HCL 1 G PO TABS: ORAL_TABLET | ORAL | 1 refills | Status: DC

## 2022-10-10 NOTE — ED Triage Notes (Signed)
Pt presents to [facility] with co of Hsv breakout. Pt reports diagnosed in 2021 no reoccur in issues  since then.

## 2022-10-10 NOTE — Discharge Instructions (Addendum)
Advised patient to take medication as directed with food to completion.  Encouraged increase daily water intake to 64 ounces per day while taking this medication.  Advised patient if symptoms worsen and/or unresolved please follow-up with PCP, GYN or here for further evaluation.

## 2022-10-10 NOTE — ED Provider Notes (Signed)
[name]    CSN: [id number] Arrival date & time: 10/10/22  1842      History   Chief Complaint Chief Complaint  Patient presents with   vaginal rash     HPI Kathy Moran is a 22 y.o. female.   HPI Pleasant 22 year old female presents with possible vaginal HSV outbreak and requests antiviral.  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  Patient reports current redness irritation and mild pain of affected area.  Past Medical History:  Diagnosis Date   Lumbar herniated disc     There are no problems to display for this patient.   Past Surgical History:  Procedure Laterality Date   NO PAST SURGERIES      OB History   No obstetric history on file.      Home Medications    Prior to Admission medications   Medication Sig Start Date End Date Taking? Authorizing Provider  valACYclovir (VALTREX) 1000 MG tablet Take 1 tab twice daily x 5 days 10/10/22  Yes Trevor Iha, FNP  Norgestimate-Ethinyl Estradiol Triphasic (TRI-ESTARYLLA) 0.18/0.215/0.25 MG-35 MCG tablet Take 1 tablet by mouth daily. 08/31/22   Doreene Burke, CNM  TRI-ESTARYLLA 0.18/0.215/0.25 MG-35 MCG tablet TAKE 1 TAB BY MOUTH ONCE A DAY FOR 28 DAYS MAX. DAILY DOSE: 1 TAB 07/28/21   Doreene Burke, CNM    Family History Family History  Problem Relation Age of Onset   Healthy Mother    Healthy Father     Social History Social History   Tobacco Use   Smoking status: Never   Smokeless tobacco: Never  Vaping Use   Vaping Use: Every day  Substance Use Topics   Alcohol use: Yes   Drug use: Never     Allergies   Shrimp [shellfish allergy]   Review of Systems Review of Systems  Skin:  Positive for rash.  All other systems reviewed and are negative.    Physical Exam Triage Vital Signs ED Triage Vitals  Enc Vitals Group     BP      Pulse      Resp      Temp      Temp src      SpO2      Weight      Height      Head  Circumference      Peak Flow      Pain Score      Pain Loc      Pain Edu?      Excl. in GC?    No data found.  Updated Vital Signs BP (!) 156/109   Pulse (!) 109   Temp 98.6 F (37 C)   Resp 19   LMP 09/29/2022   SpO2 98%   Physical Exam Vitals and nursing note reviewed.  Constitutional:      Appearance: Normal appearance. She is normal weight.  HENT:     Head: Normocephalic and atraumatic.     Mouth/Throat:     Mouth: Mucous membranes are moist.     Pharynx: Oropharynx is clear.  Eyes:     Extraocular Movements: Extraocular movements intact.     Conjunctiva/sclera: Conjunctivae normal.     Pupils: Pupils are equal, round, and reactive to light.  Cardiovascular:     Rate and Rhythm: Normal rate and regular rhythm.     Pulses: Normal pulses.     Heart sounds: Normal heart sounds.  Pulmonary:  Effort: Pulmonary effort is normal.     Breath sounds: Normal breath sounds.  Abdominal:     General: Bowel sounds are normal.  Musculoskeletal:        General: Normal range of motion.     Cervical back: Normal range of motion and neck supple.  Skin:    General: Skin is warm and dry.  Neurological:     General: No focal deficit present.     Mental Status: She is alert and oriented to person, place, and time. Mental status is at baseline.  Psychiatric:        Mood and Affect: Mood normal.        Behavior: Behavior normal.        Thought Content: Thought content normal.      UC Treatments / Results  Labs (all labs ordered are listed, but only abnormal results are displayed) Labs Reviewed - No data to display  EKG   Radiology No results found.  Procedures Procedures (including critical care time)  Medications Ordered in UC Medications - No data to display  Initial Impression / Assessment and Plan / UC Course  I have reviewed the triage vital signs and the nursing notes.  Pertinent labs & imaging results that were available during my care of the patient  were reviewed by me and considered in my medical decision making (see chart for details).     MDM: 1.  HSV 1 infection-Rx'd Valtrex 1 g twice daily x 5 days #30 with 1 refill. Advised patient to take medication as directed with food to completion.  Encouraged increase daily water intake to 64 ounces per day while taking this medication.  Advised patient if symptoms worsen and/or unresolved please follow-up with PCP, GYN or here for further evaluation.  Patient discharged home, hemodynamically stable. Final Clinical Impressions(s) / UC Diagnoses   Final diagnoses:  HSV-1 (herpes simplex virus 1) infection     Discharge Instructions      Advised patient to take medication as directed with food to completion.  Encouraged increase daily water intake to 64 ounces per day while taking this medication.  Advised patient if symptoms worsen and/or unresolved please follow-up with PCP, GYN or here for further evaluation.     ED Prescriptions     Medication Sig Dispense Auth. Provider   valACYclovir (VALTREX) 1000 MG tablet Take 1 tab twice daily x 5 days 30 tablet Trevor Iha, FNP      PDMP not reviewed this encounter.   Trevor Iha, FNP 10/10/22 1923

## 2022-11-19 ENCOUNTER — Other Ambulatory Visit: Payer: Self-pay | Admitting: Certified Nurse Midwife

## 2022-12-22 ENCOUNTER — Ambulatory Visit: Payer: Managed Care, Other (non HMO) | Admitting: Obstetrics and Gynecology

## 2022-12-22 ENCOUNTER — Encounter: Payer: Self-pay | Admitting: Obstetrics and Gynecology

## 2022-12-22 ENCOUNTER — Other Ambulatory Visit (HOSPITAL_COMMUNITY)
Admission: RE | Admit: 2022-12-22 | Discharge: 2022-12-22 | Disposition: A | Discharge status: Home / Self Care | Payer: Managed Care, Other (non HMO) | Source: Ambulatory Visit | Attending: Obstetrics and Gynecology | Admitting: Obstetrics and Gynecology

## 2022-12-22 VITALS — BP 120/70 | Ht 66.0 in | Wt 188.0 lb

## 2022-12-22 DIAGNOSIS — N898 Other specified noninflammatory disorders of vagina: Secondary | ICD-10-CM

## 2022-12-22 DIAGNOSIS — Z124 Encounter for screening for malignant neoplasm of cervix: Secondary | ICD-10-CM | POA: Diagnosis present

## 2022-12-22 DIAGNOSIS — Z01419 Encounter for gynecological examination (general) (routine) without abnormal findings: Secondary | ICD-10-CM

## 2022-12-22 DIAGNOSIS — Z113 Encounter for screening for infections with a predominantly sexual mode of transmission: Secondary | ICD-10-CM

## 2022-12-22 DIAGNOSIS — R87610 Atypical squamous cells of undetermined significance on cytologic smear of cervix (ASC-US): Secondary | ICD-10-CM | POA: Insufficient documentation

## 2022-12-22 DIAGNOSIS — A6004 Herpesviral vulvovaginitis: Secondary | ICD-10-CM

## 2022-12-22 DIAGNOSIS — Z01411 Encounter for gynecological examination (general) (routine) with abnormal findings: Secondary | ICD-10-CM

## 2022-12-22 DIAGNOSIS — Z3041 Encounter for surveillance of contraceptive pills: Secondary | ICD-10-CM

## 2022-12-22 LAB — POCT WET PREP WITH KOH
Clue Cells Wet Prep HPF POC: NEGATIVE
KOH Prep POC: NEGATIVE
Trichomonas, UA: NEGATIVE
Yeast Wet Prep HPF POC: NEGATIVE

## 2022-12-22 MED ORDER — TRI-ESTARYLLA 0.18/0.215/0.25 MG-35 MCG PO TABS: ORAL_TABLET | ORAL | 3 refills | Status: DC

## 2022-12-22 MED ORDER — NORGESTIM-ETH ESTRAD TRIPHASIC 0.18/0.215/0.25 MG-35 MCG PO TABS: ORAL_TABLET | ORAL | 3 refills | Status: DC

## 2022-12-22 MED ORDER — FLUCONAZOLE 150 MG PO TABS: 150.0000 mg | ORAL_TABLET | Freq: Once | ORAL | 0 refills | Status: AC

## 2022-12-22 NOTE — Progress Notes (Signed)
PCP:  Pcp, No   Chief Complaint  Patient presents with   Gynecologic Exam    Patient states she has genital herpes and had an outbreak in May. Was seen at an urgent care and was given treatment for 5 days. She completed 5 day treatment and feels the same.     HPI:      Ms. Kathy Moran is a 22 y.o. G0P0000 whose LMP was Patient's last menstrual period was 12/22/2022 (exact date)., presents today for her annual examination.  Her menses are regular every 28-30 days, lasting 5 days, mod flow on OCPs.  Dysmenorrhea mild, improved with ibup, no BTB.   Sex activity: single partner, contraception - OCP (estrogen/progesterone). No pain/bleeding Last Pap: 05/05/21 Results were: ASCUS with NEGATIVE high risk HPV  Hx of STDs: HSV 1 on culture 2021; had recurrent sx 5/24 LT labia minora near clitoris; went to UC and given valtrex for 5 days. Sx resolved but pt still has some tingling in that area and feels a little discomfort/itching. Saw a sore with white top yesterday. Has noticed increased vag d/c, no itching/irritation/odor.   There is a FH of breast cancer in her mother, genetic testing not indicated. There is no FH of ovarian cancer. The patient does not do self-breast exams.  Tobacco use: The patient denies current or previous tobacco use. Alcohol use: social drinker No drug use.  Exercise: not active  She does get adequate calcium but not Vitamin D in her diet. Gardasil completed  Past Medical History:  Diagnosis Date   Genital herpes 2021   HSV 1 on culture   Lumbar herniated disc     Past Surgical History:  Procedure Laterality Date   NO PAST SURGERIES      Family History  Problem Relation Age of Onset   Healthy Mother    Breast cancer Mother 89   Healthy Father     Social History   Socioeconomic History   Marital status: Single    Spouse name: Not on file   Number of children: Not on file   Years of education: Not on file   Highest education level: Not on  file  Occupational History   Not on file  Tobacco Use   Smoking status: Never   Smokeless tobacco: Never  Vaping Use   Vaping status: Former  Substance and Sexual Activity   Alcohol use: Yes    Comment: occ   Drug use: Never   Sexual activity: Yes    Birth control/protection: Pill  Other Topics Concern   Not on file  Social History Narrative   ** Merged History Encounter **       Social Determinants of Health   Financial Resource Strain: Not on file  Food Insecurity: Not on file  Transportation Needs: Not on file  Physical Activity: Not on file  Stress: Not on file  Social Connections: Not on file  Intimate Partner Violence: Not on file     Current Outpatient Medications:    fluconazole (DIFLUCAN) 150 MG tablet, Take 1 tablet (150 mg total) by mouth once for 1 dose., Disp: 1 tablet, Rfl: 0   Norgestimate-Ethinyl Estradiol Triphasic (TRI-ESTARYLLA) 0.18/0.215/0.25 MG-35 MCG tablet, TAKE 1 TAB BY MOUTH ONCE A DAY, Disp: 84 tablet, Rfl: 3   valACYclovir (VALTREX) 1000 MG tablet, Take 1 tab twice daily x 5 days (Patient not taking: Reported on 12/22/2022), Disp: 30 tablet, Rfl: 1     ROS:  Review of Systems  Constitutional:  Negative for fatigue, fever and unexpected weight change.  Respiratory:  Negative for cough, shortness of breath and wheezing.   Cardiovascular:  Negative for chest pain, palpitations and leg swelling.  Gastrointestinal:  Negative for blood in stool, constipation, diarrhea, nausea and vomiting.  Endocrine: Negative for cold intolerance, heat intolerance and polyuria.  Genitourinary:  Positive for vaginal discharge. Negative for dyspareunia, dysuria, flank pain, frequency, genital sores, hematuria, menstrual problem, pelvic pain, urgency, vaginal bleeding and vaginal pain.  Musculoskeletal:  Negative for back pain, joint swelling and myalgias.  Skin:  Negative for rash.  Neurological:  Negative for dizziness, syncope, light-headedness, numbness and  headaches.  Hematological:  Negative for adenopathy.  Psychiatric/Behavioral:  Negative for agitation, confusion, sleep disturbance and suicidal ideas. The patient is not nervous/anxious.    BREAST: No symptoms   Objective: BP 120/70   Ht 5\' 6"  (1.676 m)   Wt 188 lb (85.3 kg)   LMP 12/22/2022 (Exact Date)   BMI 30.34 kg/m    Physical Exam Constitutional:      Appearance: She is well-developed.  Genitourinary:     Vulva normal.     Genitourinary Comments: SMALL AMT CLUMPY WHITE VAG D/C ON EXAM     Right Labia: No rash, tenderness or lesions.    Left Labia: No tenderness, lesions or rash.       Vaginal discharge present.     No vaginal erythema or tenderness.      Right Adnexa: not tender and no mass present.    Left Adnexa: not tender and no mass present.    No cervical friability or polyp.     Uterus is not enlarged or tender.  Breasts:    Right: No mass, nipple discharge, skin change or tenderness.     Left: No mass, nipple discharge, skin change or tenderness.  Neck:     Thyroid: No thyromegaly.  Cardiovascular:     Rate and Rhythm: Normal rate and regular rhythm.     Heart sounds: Normal heart sounds. No murmur heard. Pulmonary:     Effort: Pulmonary effort is normal.     Breath sounds: Normal breath sounds.  Abdominal:     Palpations: Abdomen is soft.     Tenderness: There is no abdominal tenderness. There is no guarding or rebound.  Musculoskeletal:        General: Normal range of motion.     Cervical back: Normal range of motion.  Lymphadenopathy:     Cervical: No cervical adenopathy.  Neurological:     General: No focal deficit present.     Mental Status: She is alert and oriented to person, place, and time.     Cranial Nerves: No cranial nerve deficit.  Skin:    General: Skin is warm and dry.  Psychiatric:        Mood and Affect: Mood normal.        Behavior: Behavior normal.        Thought Content: Thought content normal.        Judgment:  Judgment normal.  Vitals reviewed.     Results: Results for orders placed or performed in visit on 12/22/22 (from the past 24 hour(s))  POCT Wet Prep with KOH     Status: Normal   Collection Time: 12/22/22  3:59 PM  Result Value Ref Range   Trichomonas, UA Negative    Clue Cells Wet Prep HPF POC neg    Epithelial Wet Prep HPF POC     Yeast Wet  Prep HPF POC neg    Bacteria Wet Prep HPF POC     RBC Wet Prep HPF POC     WBC Wet Prep HPF POC     KOH Prep POC Negative Negative    Assessment/Plan: Encounter for annual routine gynecological examination  Cervical cancer screening - Plan: Cytology - PAP  Screening for STD (sexually transmitted disease) - Plan: Cytology - PAP  ASCUS of cervix with negative high risk HPV - Plan: Cytology - PAP; repeat pap today. Will f/u if abn.   Encounter for surveillance of contraceptive pills - Plan: Norgestimate-Ethinyl Estradiol Triphasic (TRI-ESTARYLLA) 0.18/0.215/0.25 MG-35 MCG tablet; OCP RF eRxd.   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 Pt still with occas tingling sensation at area of lesion; neg exam. Question nerve etiology. Reassurance. F/u prn.   Vaginal discharge - Plan: fluconazole (DIFLUCAN) 150 MG tablet, POCT Wet Prep with KOH; pos exam, net wet prep. Treat empirically with Rx diflucan. F/u prn   Meds ordered this encounter  Medications   fluconazole (DIFLUCAN) 150 MG tablet    Sig: Take 1 tablet (150 mg total) by mouth once for 1 dose.    Dispense:  1 tablet    Refill:  0    Order Specific Question:   Supervising Provider    Answer:   Hildred Laser [[id number]]                                                     Norgestimate-Ethinyl Estradiol Triphasic (TRI-ESTARYLLA) 0.18/0.215/0.25 MG-35 MCG tablet    Sig: TAKE 1 TAB BY MOUTH ONCE A DAY    Dispense:  84 tablet    Refill:  3    Order Specific Question:   Supervising Provider    Answer:   Waymon Budge              GYN counsel adequate intake of calcium and vitamin D, diet and exercise     F/U  Return in about 1 year (around 12/22/2023).  Britanee Vanblarcom B. Del Wiseman, PA-C 12/22/2022 3:59 PM

## 2022-12-22 NOTE — Patient Instructions (Signed)
I value your feedback and you entrusting us with your care. If you get a [facility] patient survey, I would appreciate you taking the time to let us know about your experience today. Thank you! ? ? ?

## 2023-04-06 ENCOUNTER — Ambulatory Visit
Admission: RE | Admit: 2023-04-06 | Discharge: 2023-04-06 | Disposition: A | Discharge status: Home / Self Care | Payer: Managed Care, Other (non HMO) | Source: Ambulatory Visit | Attending: Family Medicine | Admitting: Family Medicine

## 2023-04-06 VITALS — BP 137/92 | HR 105 | Temp 98.9°F | Resp 17

## 2023-04-06 DIAGNOSIS — B029 Zoster without complications: Secondary | ICD-10-CM

## 2023-04-06 MED ORDER — VALACYCLOVIR HCL 1 G PO TABS: 1000.0000 mg | ORAL_TABLET | Freq: Three times a day (TID) | ORAL | 0 refills | Status: DC

## 2023-04-06 NOTE — ED Provider Notes (Signed)
[name]    CSN: [id number] Arrival date & time: 04/06/23  1804      History   Chief Complaint Chief Complaint  Patient presents with   Rash    APPT 615PM    HPI Kathy Moran is a 22 y.o. female.   HPI 22 year old female presents with a rash of left ear.  Reports itchy and burning.  Reports taking OTC sertraline at 12 PM.  PMH significant for obesity, genital herpes, and herniated lumbar disc.  Past Medical History:  Diagnosis Date   Genital herpes 2021   HSV 1 on culture   Lumbar herniated disc     Patient Active Problem List   Diagnosis Date Noted   ASCUS of cervix with negative high risk HPV 12/22/2022   Herpes simplex vulvovaginitis 12/22/2022    Past Surgical History:  Procedure Laterality Date   NO PAST SURGERIES      OB History     Gravida  0   Para  0   Term  0   Preterm  0   AB  0   Living  0      SAB  0   IAB  0   Ectopic  0   Multiple  0   Live Births  0            Home Medications    Prior to Admission medications   Medication Sig Start Date End Date Taking? Authorizing Provider  valACYclovir (VALTREX) 1000 MG tablet Take 1 tablet (1,000 mg total) by mouth 3 (three) times daily. 04/06/23  Yes Trevor Iha, FNP  Norgestimate-Ethinyl Estradiol Triphasic (TRI-ESTARYLLA) 0.18/0.215/0.25 MG-35 MCG tablet TAKE 1 TAB BY MOUTH ONCE A DAY 12/22/22   Copland, Ilona Sorrel, PA-C  valACYclovir (VALTREX) 1000 MG tablet Take 1 tab twice daily x 5 days Patient not taking: Reported on 12/22/2022 10/10/22   Trevor Iha, FNP    Family History Family History  Problem Relation Age of Onset   Healthy Mother    Breast cancer Mother 49   Healthy Father     Social History Social History   Tobacco Use   Smoking status: Never   Smokeless tobacco: Never  Vaping Use   Vaping status: Former  Substance Use Topics   Alcohol use: Yes    Comment: occ   Drug use: Never     Allergies   Shrimp [shellfish  allergy]   Review of Systems Review of Systems  Skin:  Positive for rash.  All other systems reviewed and are negative.    Physical Exam Triage Vital Signs ED Triage Vitals [04/06/23 1814]  Encounter Vitals Group     BP (!) 153/109     Systolic BP Percentile      Diastolic BP Percentile      Pulse Rate (!) 121     Resp 17     Temp 98.9 F (37.2 C)     Temp Source Oral     SpO2 99 %     Weight      Height      Head Circumference      Peak Flow      Pain Score 2     Pain Loc      Pain Education      Exclude from Growth Chart    No data found.  Updated Vital Signs BP (!) 153/109 (BP Location: Right Arm)   Pulse (!) 121   Temp 98.9 F (37.2 C) (Oral)  Resp 17   LMP 03/15/2023 (Approximate)   SpO2 99%    Physical Exam Vitals and nursing note reviewed.  Constitutional:      Appearance: Normal appearance. She is normal weight.  HENT:     Head: Normocephalic and atraumatic.     Right Ear: External ear normal.     Left Ear: External ear normal.     Mouth/Throat:     Mouth: Mucous membranes are moist.     Pharynx: Oropharynx is clear.  Eyes:     Extraocular Movements: Extraocular movements intact.     Conjunctiva/sclera: Conjunctivae normal.     Pupils: Pupils are equal, round, and reactive to light.  Cardiovascular:     Rate and Rhythm: Normal rate and regular rhythm.     Pulses: Normal pulses.     Heart sounds: Normal heart sounds.  Pulmonary:     Effort: Pulmonary effort is normal.     Breath sounds: No wheezing, rhonchi or rales.  Musculoskeletal:        General: Normal range of motion.     Cervical back: Normal range of motion and neck supple.  Skin:    General: Skin is warm and dry.     Comments: Left-sided posterior neck/l left lobule/left sided upper cheek: Pruritic, erythematous grouped newly developing vesicular lesions noted-see images below  Neurological:     General: No focal deficit present.     Mental Status: She is alert and oriented to  person, place, and time. Mental status is at baseline.  Psychiatric:        Mood and Affect: Mood normal.        Behavior: Behavior normal.        Thought Content: Thought content normal.         UC Treatments / Results  Labs (all labs ordered are listed, but only abnormal results are displayed) Labs Reviewed - No data to display  EKG   Radiology No results found.  Procedures Procedures (including critical care time)  Medications Ordered in UC Medications - No data to display  Initial Impression / Assessment and Plan / UC Course  I have reviewed the triage vital signs and the nursing notes.  Pertinent labs & imaging results that were available during my care of the patient were reviewed by me and considered in my medical decision making (see chart for details).     MDM: 1.  Herpes zoster without complication-Rx'd Valtrex 1000 mg tablet: Take 1 tablet 3 times daily x 7 days. Advised patient to take medication as directed with food to completion.  Encouraged to increase daily water intake to 64 ounces per day while taking this medication.  Advised if symptoms worsen and/or unresolved please follow-up with PCP or here for further evaluation.  Patient discharged home, hemodynamically stable. Advised patient to take medication as directed with food to completion.  Encouraged to increase daily water intake to 64 ounces per day while taking this medication.  Advised if symptoms worsen and/or unresolved please follow-up with PCP or here for further evaluation.  Patient discharged home, hemodynamically stable. Final Clinical Impressions(s) / UC Diagnoses   Final diagnoses:  Herpes zoster without complication     Discharge Instructions      Advised patient to take medication as directed with food to completion.  Encouraged to increase daily water intake to 64 ounces per day while taking this medication.  Advised if symptoms worsen and/or unresolved please follow-up with PCP or  here for further evaluation.  ED Prescriptions     Medication Sig Dispense Auth. Provider   valACYclovir (VALTREX) 1000 MG tablet Take 1 tablet (1,000 mg total) by mouth 3 (three) times daily. 21 tablet Trevor Iha, FNP      PDMP not reviewed this encounter.   Trevor Iha, FNP 04/06/23 [place]

## 2023-04-06 NOTE — Discharge Instructions (Addendum)
 Advised patient to take medication as directed with food to completion.  Encouraged to increase daily water intake to 64 ounces per day while taking this medication.  Advised if symptoms worsen and/or unresolved please follow-up with PCP or here for further evaluation.

## 2023-04-06 NOTE — ED Triage Notes (Signed)
Pt c/o rash to LT ear. Itchy and burning. Cetirizine at 12p.

## 2023-04-17 ENCOUNTER — Encounter: Payer: Self-pay | Admitting: Emergency Medicine

## 2023-04-17 ENCOUNTER — Ambulatory Visit
Admission: EM | Admit: 2023-04-17 | Discharge: 2023-04-17 | Disposition: A | Discharge status: Home / Self Care | Payer: Managed Care, Other (non HMO) | Attending: Family Medicine | Admitting: Family Medicine

## 2023-04-17 DIAGNOSIS — L509 Urticaria, unspecified: Secondary | ICD-10-CM

## 2023-04-17 DIAGNOSIS — T7840XA Allergy, unspecified, initial encounter: Secondary | ICD-10-CM

## 2023-04-17 MED ORDER — PREDNISONE 20 MG PO TABS: 40.0000 mg | ORAL_TABLET | Freq: Every day | ORAL | 0 refills | Status: DC

## 2023-04-17 NOTE — ED Triage Notes (Signed)
Patient c/o red circular areas that have appeared on her left arm near elbow, left buttock/hip area and her right mid back area that all appeared today.  The areas are itchy.  Patient was just diagnosed with shingles on 04/06/2023 and just completed the medication given.

## 2023-04-17 NOTE — Discharge Instructions (Signed)
Call Dr. Verna Czech office to set up a primary care doctor Take Zyrtec or Claritin (nondrowsy antihistamine) 2 pills in the morning until rash is clear Take Benadryl 25 mg at night also for the rash If the rash recurs you may need an allergy specialist Take the prednisone as directed

## 2023-04-17 NOTE — ED Provider Notes (Signed)
[name]    CSN: [id number] Arrival date & time: 04/17/23  1811      History   Chief Complaint Chief Complaint  Patient presents with   Rash    HPI Kathy Moran is a 22 y.o. female.   Patient is here for of hives.  They started today.  She has big red patches scattered on trunk arms and legs.  Very itchy.  No trouble breathing.  No trouble speaking or swallowing.  Has never had hives before    Past Medical History:  Diagnosis Date   Genital herpes 2021   HSV 1 on culture   Lumbar herniated disc     Patient Active Problem List   Diagnosis Date Noted   ASCUS of cervix with negative high risk HPV 12/22/2022   Herpes simplex vulvovaginitis 12/22/2022    Past Surgical History:  Procedure Laterality Date   NO PAST SURGERIES      OB History     Gravida  0   Para  0   Term  0   Preterm  0   AB  0   Living  0      SAB  0   IAB  0   Ectopic  0   Multiple  0   Live Births  0            Home Medications    Prior to Admission medications   Medication Sig Start Date End Date Taking? Authorizing Provider  Norgestimate-Ethinyl Estradiol Triphasic (TRI-ESTARYLLA) 0.18/0.215/0.25 MG-35 MCG tablet TAKE 1 TAB BY MOUTH ONCE A DAY 12/22/22  Yes Copland, Alicia B, PA-C  predniSONE (DELTASONE) 20 MG tablet Take 2 tablets (40 mg total) by mouth daily with breakfast. 04/17/23  Yes Eustace Moore, MD  valACYclovir (VALTREX) 1000 MG tablet Take 1 tablet (1,000 mg total) by mouth 3 (three) times daily. 04/06/23  Yes Trevor Iha, FNP    Family History Family History  Problem Relation Age of Onset   Healthy Mother    Breast cancer Mother 33   Healthy Father     Social History Social History   Tobacco Use   Smoking status: Never   Smokeless tobacco: Never  Vaping Use   Vaping status: Former  Substance Use Topics   Alcohol use: Yes    Comment: occ   Drug use: Never     Allergies   Shrimp [shellfish  allergy]   Review of Systems Review of Systems See HPI  Physical Exam Triage Vital Signs ED Triage Vitals  Encounter Vitals Group     BP 04/17/23 1850 (!) 135/96     Systolic BP Percentile --      Diastolic BP Percentile --      Pulse Rate 04/17/23 1850 88     Resp 04/17/23 1850 16     Temp 04/17/23 1850 98.4 F (36.9 C)     Temp Source 04/17/23 1850 Oral     SpO2 04/17/23 1850 100 %     Weight 04/17/23 1851 190 lb (86.2 kg)     Height 04/17/23 1851 5\' 6"  (1.676 m)     Head Circumference --      Peak Flow --      Pain Score 04/17/23 1851 0     Pain Loc --      Pain Education --      Exclude from Growth Chart --    No data found.  Updated Vital Signs BP (!) 135/96 (BP Location:  Right Arm)   Pulse 88   Temp 98.4 F (36.9 C) (Oral)   Resp 16   Ht 5\' 6"  (1.676 m)   Wt 86.2 kg   LMP 04/13/2023 (Approximate)   SpO2 100%   BMI 30.67 kg/m       Physical Exam Constitutional:      General: She is not in acute distress.    Appearance: She is well-developed. She is not ill-appearing.  HENT:     Head: Normocephalic and atraumatic.  Eyes:     Conjunctiva/sclera: Conjunctivae normal.     Pupils: Pupils are equal, round, and reactive to light.  Cardiovascular:     Rate and Rhythm: Normal rate.  Pulmonary:     Effort: Pulmonary effort is normal. No respiratory distress.     Breath sounds: Normal breath sounds.  Abdominal:     General: There is no distension.     Palpations: Abdomen is soft.  Musculoskeletal:        General: Normal range of motion.     Cervical back: Normal range of motion.  Skin:    General: Skin is warm and dry.     Findings: Lesion present.     Comments: Urticarial hives scattered on arms legs trunk and back that measure anywhere from 2 cm to 4 cm across  Neurological:     Mental Status: She is alert.      UC Treatments / Results  Labs (all labs ordered are listed, but only abnormal results are displayed) Labs Reviewed - No data to  display  EKG   Radiology No results found.  Procedures Procedures (including critical care time)  Medications Ordered in UC Medications - No data to display  Initial Impression / Assessment and Plan / UC Course  I have reviewed the triage vital signs and the nursing notes.  Pertinent labs & imaging results that were available during my care of the patient were reviewed by me and considered in my medical decision making (see chart for details).     Urticaria, unclear etiology Final Clinical Impressions(s) / UC Diagnoses   Final diagnoses:  Urticaria  Allergic reaction, initial encounter     Discharge Instructions      Call Dr. Verna Czech office to set up a primary care doctor Take Zyrtec or Claritin (nondrowsy antihistamine) 2 pills in the morning until rash is clear Take Benadryl 25 mg at night also for the rash If the rash recurs you may need an allergy specialist Take the prednisone as directed   ED Prescriptions     Medication Sig Dispense Auth. Provider   predniSONE (DELTASONE) 20 MG tablet Take 2 tablets (40 mg total) by mouth daily with breakfast. 10 tablet Eustace Moore, MD      PDMP not reviewed this encounter.   Eustace Moore, MD 04/17/23 [id number]

## 2023-04-19 ENCOUNTER — Encounter: Payer: Self-pay | Admitting: Family Medicine

## 2023-04-19 ENCOUNTER — Ambulatory Visit: Payer: Managed Care, Other (non HMO) | Admitting: Family Medicine

## 2023-04-19 VITALS — BP 137/86 | HR 124 | Temp 98.2°F | Resp 18 | Ht 66.0 in | Wt 190.6 lb

## 2023-04-19 DIAGNOSIS — Z7689 Persons encountering health services in other specified circumstances: Secondary | ICD-10-CM | POA: Diagnosis not present

## 2023-04-19 DIAGNOSIS — L508 Other urticaria: Secondary | ICD-10-CM | POA: Diagnosis not present

## 2023-04-19 MED ORDER — MONTELUKAST SODIUM 10 MG PO TABS
10.0000 mg | ORAL_TABLET | Freq: Every day | ORAL | 0 refills | Status: DC
Start: 2023-04-19 — End: 2023-05-22

## 2023-04-19 NOTE — Progress Notes (Signed)
New Patient Office Visit  Subjective    Patient ID: Kathy Moran, female    DOB: [birth year]  Age: 22 y.o. MRN: [id number]  CC:  Chief Complaint  Patient presents with   Establish Care    Patient is here to establish care with new provider   Rash    Patient states that the rashes started on 04/06/2023  on the left side of her face , went to urgent care was told it was the shingles , took valtrex and then went back to urgent care on 04/17/2023 because the rash started to appear on her left arm, left thigh, and lower right side if her back she was told then that it was hives and given allergy medicine . Patient states rashes are not getting any better    HPI Kathy Moran presents to establish care. Pt is new to me.  Rashes Pt reports she went to urgent care on 04/06/23 due to what looked like mosquito bites on the left side of her face. She reports it started to sting. She went to urgent care and was diagnosed with shingles. She received Valtrex and finished it last Thursday. She then went back due to another episode of similar rashes on 11/18. She then noticed rashes that came up on her back and left hip. They do itch. They gave her steroids and this seemed to help. She reports she bought Vitamin D and Ashwanda gummies a month ago but hasn't taken it consistently. She has no pets. She reports she just got a promotion in the last week at work and she's had increased stress in the last week. She does also report that her hives started the morning that she was to get her contract. Pt reports some anxiety but she can manage this without medicines. She only takes birth control pills. Pt reports allergy to shrimp when she was younger. She ate one and her mouth started to itch.     04/19/2023    8:42 AM  GAD 7 : Generalized Anxiety Score  Nervous, Anxious, on Edge 1  Control/stop worrying 0  Worry too much - different things 1  Trouble relaxing 0  Restless 1  Easily annoyed or  irritable 1  Afraid - awful might happen 0  Total GAD 7 Score 4  Anxiety Difficulty Somewhat difficult      Outpatient Encounter Medications as of 04/19/2023  Medication Sig   Norgestimate-Ethinyl Estradiol Triphasic (TRI-ESTARYLLA) 0.18/0.215/0.25 MG-35 MCG tablet TAKE 1 TAB BY MOUTH ONCE A DAY   predniSONE (DELTASONE) 20 MG tablet Take 2 tablets (40 mg total) by mouth daily with breakfast.   valACYclovir (VALTREX) 1000 MG tablet Take 1 tablet (1,000 mg total) by mouth 3 (three) times daily. (Patient not taking: Reported on 04/19/2023)   No facility-administered encounter medications on file as of 04/19/2023.    Past Medical History:  Diagnosis Date   Anxiety    Undiagnosed   Genital herpes 2021   HSV 1 on culture   Hypertension March 2022   Lumbar herniated disc     Past Surgical History:  Procedure Laterality Date   NO PAST SURGERIES      Family History  Problem Relation Age of Onset   Healthy Mother    Breast cancer Mother 44   Cancer Mother    Diabetes Mother    Hypertension Mother    Healthy Father    Diabetes Maternal Grandmother     Social History   Socioeconomic History  Marital status: Single    Spouse name: Not on file   Number of children: Not on file   Years of education: Not on file   Highest education level: Bachelor's degree (e.g., BA, AB, BS)  Occupational History   Not on file  Tobacco Use   Smoking status: Former    Current packs/day: 0.00    Types: E-cigarettes, Cigarettes    Quit date: 03/31/2022    Years since quitting: 1.0    Passive exposure: Past   Smokeless tobacco: Never  Vaping Use   Vaping status: Former  Substance and Sexual Activity   Alcohol use: Yes    Alcohol/week: 1.0 - 2.0 standard drink of alcohol    Types: 1 - 2 Glasses of wine per week    Comment: occ   Drug use: Never   Sexual activity: Yes    Birth control/protection: Pill  Other Topics Concern   Not on file  Social History Narrative   ** Merged History  Encounter **       Social Determinants of Health   Financial Resource Strain: Low Risk  (04/18/2023)   Overall Financial Resource Strain (CARDIA)    Difficulty of Paying Living Expenses: Not hard at all  Food Insecurity: No Food Insecurity (04/18/2023)   Hunger Vital Sign    Worried About Running Out of Food in the Last Year: Never true    Ran Out of Food in the Last Year: Never true  Transportation Needs: No Transportation Needs (04/18/2023)   PRAPARE - Administrator, Civil Service (Medical): No    Lack of Transportation (Non-Medical): No  Physical Activity: Insufficiently Active (04/18/2023)   Exercise Vital Sign    Days of Exercise per Week: 1 day    Minutes of Exercise per Session: 30 min  Stress: Stress Concern Present (04/18/2023)   [employer] of Occupational Health - Occupational Stress Questionnaire    Feeling of Stress : To some extent  Social Connections: Moderately Integrated (04/18/2023)   Social Connection and Isolation Panel [NHANES]    Frequency of Communication with Friends and Family: More than three times a week    Frequency of Social Gatherings with Friends and Family: Once a week    Attends Religious Services: 1 to 4 times per year    Active Member of [employer] or Organizations: No    Attends Engineer, structural: Not on file    Marital Status: Living with partner  Intimate Partner Violence: Not on file    Review of Systems  Skin:  Positive for rash.  All other systems reviewed and are negative.      Objective    BP 137/86   Pulse (!) 124   Temp 98.2 F (36.8 C) (Oral)   Resp 18   Ht 5\' 6"  (1.676 m)   Wt 190 lb 9.6 oz (86.5 kg)   LMP 04/13/2023 (Approximate)   SpO2 99%   BMI 30.76 kg/m   Physical Exam Vitals and nursing note reviewed.  Constitutional:      Appearance: Normal appearance. She is normal weight.  HENT:     Head: Normocephalic and atraumatic.     Right Ear: External ear normal.     Left Ear: External  ear normal.     Nose: Nose normal.     Mouth/Throat:     Mouth: Mucous membranes are moist.     Pharynx: Oropharynx is clear.  Eyes:     Conjunctiva/sclera: Conjunctivae normal.  Pupils: Pupils are equal, round, and reactive to light.  Cardiovascular:     Rate and Rhythm: Normal rate.  Pulmonary:     Effort: Pulmonary effort is normal.  Skin:    Capillary Refill: Capillary refill takes less than 2 seconds.     Comments: Erythematous circumscribed macules to back, left lateral thigh and left cheek  Neurological:     General: No focal deficit present.     Mental Status: She is alert and oriented to person, place, and time. Mental status is at baseline.  Psychiatric:        Mood and Affect: Mood normal.        Behavior: Behavior normal.        Thought Content: Thought content normal.        Judgment: Judgment normal.       Assessment & Plan:   Problem List Items Addressed This Visit   None  Encounter to establish care with new doctor  Chronic urticaria -     Montelukast Sodium; Take 1 tablet (10 mg total) by mouth at bedtime.  Dispense: 30 tablet; Refill: 0 -     Ambulatory referral to Allergy -     IgE  Return for IG E panel. Refer to allergy for skin testing Start Singulair 10mg  at night after lab draw Complete prednisone until gone May use topical steroid cream for now prn for itching. If she develops another flare, she was advised to go ahead and start the Singulair.  No follow-ups on file.   Suzan Slick, MD

## 2023-04-27 LAB — IGE: IgE (Immunoglobulin E), Serum: 597 [IU]/mL — ABNORMAL HIGH (ref 6–495)

## 2023-05-01 ENCOUNTER — Encounter: Payer: Self-pay | Admitting: Family Medicine

## 2023-05-22 ENCOUNTER — Other Ambulatory Visit: Payer: Self-pay | Admitting: Family Medicine

## 2023-05-22 DIAGNOSIS — L508 Other urticaria: Secondary | ICD-10-CM

## 2023-06-15 NOTE — Progress Notes (Signed)
NEW PATIENT Date of Service/Encounter:  06/16/23 Referring provider: Suzan Slick, MD Primary care provider: Suzan Slick, MD  Subjective:  Kathy Moran is a 23 y.o. female with a PMHx of ASCUS presenting today for evaluation of hives and shellfish allergy. History obtained from: chart review and patient.   Discussed the use of AI scribe software for clinical note transcription with the patient, who gave verbal consent to proceed.  History of Present Illness   The patient, with a history of shellfish allergy, presented with a concern about a past episode of hives. The hives first appeared on November 7th of the previous year and lasted for about two weeks. The patient reported that the hives were widespread, not localized to any specific area. During the episode, the patient was initially misdiagnosed with shingles and treated with Valtrex for a week. However, when the hives continued to appear, the patient was correctly diagnosed with hives at an urgent care visit and started on Zyrtec and prednisone. The patient was also on Singulair, which was discontinued two weeks prior to the current consultation. The patient noted a potential trigger for the hives could have been a tattoo she got a week before the hives first appeared. However, she also acknowledged a significant work-related stressor around the same time, as she had recently been promoted due to her company's acquisition of another company.  The patient reported no recurrence of hives since November. She also reported a history of seasonal allergies, particularly in the spring, manifesting as itchy eyes and a runny nose.   The patient also reported a potential shellfish allergy, specifically to shrimp, which was identified in childhood. The patient experienced an itchy, tingly mouth after eating shrimp and has avoided it since. However, she has been able to consume other shellfish, such as lobster and crab, without any  adverse reactions. The patient also reported a history of dry skin and "chicken skin" in childhood, but no current signs of eczema were observed.  The patient denied any changes in her daily routine, diet, or medications around the time the hives appeared. She also denied any history of asthma, medication allergies, or insect allergies.      Chart Review:  Reviewed PCP notes from referral 04/19/23: "Chronic urticaria -     Montelukast Sodium; Take 1 tablet (10 mg total) by mouth at bedtime.  Dispense: 30 tablet; Refill: 0 -     Ambulatory referral to Allergy -     IgE" ED visit 04/17/2023 for urticaria.  Started that day, new recurrence.  Recommended 2 tablets of Zyrtec or Claritin and Benadryl nightly as needed.  Was also prescribed prednisone. 04/24/23: IgE 597   Past Medical History: Past Medical History:  Diagnosis Date   Anxiety    Undiagnosed   Genital herpes 2021   HSV 1 on culture   Hypertension March 2022   Lumbar herniated disc    Medication List:  Current Outpatient Medications  Medication Sig Dispense Refill   montelukast (SINGULAIR) 10 MG tablet TAKE 1 TABLET BY MOUTH EVERYDAY AT BEDTIME 90 tablet 1   Norgestimate-Ethinyl Estradiol Triphasic (TRI-ESTARYLLA) 0.18/0.215/0.25 MG-35 MCG tablet TAKE 1 TAB BY MOUTH ONCE A DAY 84 tablet 3   No current facility-administered medications for this visit.   Known Allergies:  Allergies  Allergen Reactions   Shrimp [Shellfish Allergy]    Past Surgical History: Past Surgical History:  Procedure Laterality Date   NO PAST SURGERIES     Family History: Family History  Problem Relation Age of Onset   Healthy Mother    Breast cancer Mother 29   Cancer Mother    Diabetes Mother    Hypertension Mother    Healthy Father    Diabetes Maternal Grandmother    Social History: Kathy Moran lives in a house built 1.5 years ago, no water damage, carpet in the bedroom, electric heating, central AC, no pets, no roaches, not using dust mite  covers on the bed of the pillows, no smoke exposure.  Works as Scientist, product/process development for [employer], exposed to fumes chemicals or dust at work, not living near an interstate/industrial area, with no HEPA filter in the home.   ROS:  All other systems negative except as noted per HPI.  Objective:  Blood pressure 138/86, pulse (!) 106, temperature (!) 97.3 F (36.3 C), temperature source Temporal, resp. rate 16, height 5\' 6"  (1.676 m), weight 189 lb 8 oz (86 kg), SpO2 100%. Body mass index is 30.59 kg/m. Physical Exam:  General Appearance:  Alert, cooperative, no distress, appears stated age  Head:  Normocephalic, without obvious abnormality, atraumatic  Eyes:  Conjunctiva clear, EOM's intact  Ears EACs normal bilaterally and normal TMs bilaterally  Nose: Nares normal, hypertrophic turbinates, normal mucosa, no visible anterior polyps, and septum midline  Throat: Lips, tongue normal; teeth and gums normal, normal posterior oropharynx  Neck: Supple, symmetrical  Lungs:   clear to auscultation bilaterally, Respirations unlabored, no coughing  Heart:  regular rate and rhythm and no murmur, Appears well perfused  Extremities: No edema  Skin: Dry skin without rash  Neurologic: No gross deficits   Diagnostics:  Labs:  Lab Orders  No laboratory test(s) ordered today     Assessment and Plan  Assessment and Plan    Urticaria (Hives)-acute Episode of hives in November of last year lasting for two weeks. No recurrence since then. No current medications. Possible triggers discussed including stress and tattoo application, but no clear cause identified. No current signs of autoimmune disease.  No concern for food, medication or environmental allergies as cause for these hives.  She has not made any modifications to her regular way of living since the occurrence of hives. -No further workup or treatment needed at this time unless hives recur.  Shellfish Allergy History of itchy, tingly  mouth after eating shrimp as a child. No recent exposure or reactions. Able to eat other shellfish (lobster, crab) without issue. -Plan skin testing for shrimp allergy on 07/07/2023. -If skin testing is negative, proceed with blood test to confirm. -If both tests are negative, consider in-office shrimp challenge. -Provide EpiPen for emergency use until allergy status is clarified.  Seasonal Allergies Mild symptoms during spring season. No current treatment. -Skin testing for environmental allergies on February 7, - When symptomatic consider taking a nonsedating oral antihistamine Your options include: Zyrtec (cetirizine) 10 mg, Claritin (loratadine) 10 mg, Xyzal (levocetirizine) 5 mg or Allegra (fexofenadine) 180 mg daily as needed -Can also use Pataday or Zaditor eyedrops (can purchase over-the-counter) daily as needed  Dry Skin History of "chicken skin" and dryness, particularly on legs. No current signs of eczema. -Use thick emolient creams to keep skin hydrated (vanicream, eucerin, cerave, aquaphor, etc)     Follow up : February 7th for allergy testing at 3 p.m.- 1-55, shellfish, must be off all antihistamines for 3 days prior to this visit It was a pleasure meeting you in clinic today! Thank you for allowing me to participate in your care.  Tonny Bollman,  MD [facility]   This note in its entirety was forwarded to the Provider who requested this consultation.  Other: samples provided of: Vanicream  Thank you for your kind referral. I appreciate the opportunity to take part in Kathy Moran's care. Please do not hesitate to contact me with questions.  Sincerely,  Tonny Bollman, MD [facility]

## 2023-06-16 ENCOUNTER — Ambulatory Visit: Payer: Managed Care, Other (non HMO) | Admitting: Internal Medicine

## 2023-06-16 ENCOUNTER — Encounter: Payer: Self-pay | Admitting: Internal Medicine

## 2023-06-16 VITALS — BP 138/86 | HR 106 | Temp 97.3°F | Resp 16 | Ht 66.0 in | Wt 189.5 lb

## 2023-06-16 DIAGNOSIS — Z91013 Allergy to seafood: Secondary | ICD-10-CM

## 2023-06-16 DIAGNOSIS — L299 Pruritus, unspecified: Secondary | ICD-10-CM | POA: Diagnosis not present

## 2023-06-16 DIAGNOSIS — L508 Other urticaria: Secondary | ICD-10-CM

## 2023-06-16 DIAGNOSIS — J310 Chronic rhinitis: Secondary | ICD-10-CM

## 2023-06-16 MED ORDER — EPINEPHRINE 0.3 MG/0.3ML IJ SOAJ: 0.3000 mg | INTRAMUSCULAR | 2 refills | Status: AC | PRN

## 2023-06-16 NOTE — Patient Instructions (Signed)
Urticaria (Hives)-acute Episode of hives in November of last year lasting for two weeks. No recurrence since then. No current medications. Possible triggers discussed including stress and tattoo application, but no clear cause identified. No current signs of autoimmune disease.  No concern for food, medication or environmental allergies as cause for these hives.  She has not made any modifications to her regular way of living since the occurrence of hives. -No further workup or treatment needed at this time unless hives recur.  Shellfish Allergy History of itchy, tingly mouth after eating shrimp as a child. No recent exposure or reactions. Able to eat other shellfish (lobster, crab) without issue. -Plan skin testing for shrimp allergy on 07/07/2023. -If skin testing is negative, proceed with blood test to confirm. -If both tests are negative, consider in-office shrimp challenge. -Provide EpiPen for emergency use until allergy status is clarified.  Seasonal Allergies Mild symptoms during spring season. No current treatment. -Skin testing for environmental allergies on February 7, - When symptomatic consider taking a nonsedating oral antihistamine Your options include: Zyrtec (cetirizine) 10 mg, Claritin (loratadine) 10 mg, Xyzal (levocetirizine) 5 mg or Allegra (fexofenadine) 180 mg daily as needed -Can also use Pataday or Zaditor eyedrops (can purchase over-the-counter) daily as needed  Dry Skin History of "chicken skin" and dryness, particularly on legs. No current signs of eczema. -Use thick emolient creams to keep skin hydrated (vanicream, eucerin, cerave, aquaphor, etc)     Follow up : February 7th for allergy testing at 3 p.m.- 1-55, shellfish, must be off all antihistamines for 3 days prior to this visit It was a pleasure meeting you in clinic today! Thank you for allowing me to participate in your care.

## 2023-07-07 ENCOUNTER — Encounter: Payer: Self-pay | Admitting: Internal Medicine

## 2023-07-07 ENCOUNTER — Ambulatory Visit: Payer: Managed Care, Other (non HMO) | Admitting: Internal Medicine

## 2023-07-07 DIAGNOSIS — M2540 Effusion, unspecified joint: Secondary | ICD-10-CM

## 2023-07-07 DIAGNOSIS — J3089 Other allergic rhinitis: Secondary | ICD-10-CM | POA: Diagnosis not present

## 2023-07-07 DIAGNOSIS — L508 Other urticaria: Secondary | ICD-10-CM

## 2023-07-07 DIAGNOSIS — Z91013 Allergy to seafood: Secondary | ICD-10-CM

## 2023-07-07 DIAGNOSIS — J302 Other seasonal allergic rhinitis: Secondary | ICD-10-CM | POA: Diagnosis not present

## 2023-07-07 DIAGNOSIS — L299 Pruritus, unspecified: Secondary | ICD-10-CM

## 2023-07-07 NOTE — Progress Notes (Signed)
 Date of Service/Encounter:  07/07/23  Allergy  testing appointment   Initial visit on 06/16/23, seen for acute urticaria.  Please see that note for additional details.  Today reports for allergy  diagnostic testing:    DIAGNOSTICS:  Skin Testing: Environmental allergy  panel and select foods. Adequate positive and negative controls. Results discussed with patient/family.  Airborne Adult Perc - 07/07/23 1452     Time Antigen Placed 0300    Allergen Manufacturer Jestine    Location Back    Number of Test 55    1. Control-Buffer 50% Glycerol Negative    2. Control-Histamine 3+    3. Bahia Negative    4. Bermuda Negative    5. Johnson Negative    6. [place]  Blue Negative    7. Meadow Fescue 2+    8. Perennial Rye Negative    9. Timothy Negative    10. Ragweed Mix Negative    11. Cocklebur Negative    12. Plantain,  English Negative    13. Baccharis Negative    14. Dog Fennel Negative    15. Russian Thistle Negative    16. Lamb's Quarters Negative    17. Sheep Sorrell Negative    18. Rough Pigweed Negative    19. Marsh Elder, Rough Negative    20. Mugwort, Common Negative    21. Box, Elder Negative    22. Cedar, red Negative    23. Sweet Gum Negative    24. Pecan Pollen Negative    25. Pine Mix Negative    26. Walnut, Black Pollen Negative    27. Red Mulberry Negative    28. Ash Mix Negative    29. Birch Mix 2+    30. Beech American 2+    31. Cottonwood, Eastern Negative    32. Hickory, White 3+    33. Maple Mix Negative    34. Oak, Eastern Mix Negative    35. Sycamore Eastern Negative    36. Alternaria Alternata Negative    37. Cladosporium Herbarum Negative    38. Aspergillus Mix Negative    39. Penicillium Mix Negative    40. Bipolaris Sorokiniana (Helminthosporium) Negative    41. Drechslera Spicifera (Curvularia) Negative    42. Mucor Plumbeus Negative    43. Fusarium Moniliforme Negative    44. Aureobasidium Pullulans (pullulara) Negative    45. Rhizopus  Oryzae Negative    46. Botrytis Cinera Negative    47. Epicoccum Nigrum Negative    48. Phoma Betae Negative    49. Dust Mite Mix 2+    50. Cat Hair 10,000 BAU/ml 3+    51.  Dog Epithelia Negative    52. Mixed Feathers Negative    53. Horse Epithelia Negative    54. Cockroach, German Negative    55. Tobacco Leaf Negative             Intradermal - 07/07/23 1605     Time Antigen Placed 0345    Location Arm    Number of Test 11    Control 3+    Bahia Negative    Bermuda Negative    Johnson Negative    Ragweed Mix Negative    Weed Mix Negative    Mold 1 Negative    Mold 2 Negative    Mold 4 Negative    Dog Negative    Cockroach Negative             Food Adult Perc - 07/07/23 1500     Time Antigen  Placed 0300    Allergen Manufacturer Greer    Location Back    Number of allergen test 6    8. Shellfish Mix Negative    23. Shrimp Negative    24. Crab Negative    25. Lobster Negative    26. Oyster Negative    27. Scallops Negative             Allergy  testing results were read and interpreted by myself, documented by clinical staff.  Patient provided with copy of allergy  testing along with avoidance measures when indicated.   Rocky Endow, MD  [facility] 

## 2023-07-07 NOTE — Patient Instructions (Addendum)
 Urticaria (Hives)-acute Episode of hives in November of last year lasting for two weeks. No recurrence since then. No current medications. Possible triggers discussed including stress and tattoo application, but no clear cause identified. No current signs of autoimmune disease.  No concern for food, medication or environmental allergies as cause for these hives.  She has not made any modifications to her regular way of living since the occurrence of hives. Interim Hx: today reports toe and finger swelling lasting < 24 hours, with burning tingling.  - start zyrtec 10-20 mg up to twice daily for swelling. -labs today: ANA, Rf  Shellfish Allergy  History of itchy, tingly mouth after eating shrimp as a child. No recent exposure or reactions. Able to eat other shellfish (lobster, crab) without issue. -Skin testing was negative to shellfish -proceed with blood test to confirm. -If both tests are negative, consider in-office shrimp challenge. -Provide EpiPen  for emergency use until allergy  status is clarified.  Seasonal Allergies Mild symptoms during spring season. No current treatment. -Skin testing today was positive to tree pollen, dust mite, fescue grass and cat. Intradermal testing negative - When symptomatic consider taking a nonsedating oral antihistamine Your options include: Zyrtec (cetirizine) 10 mg, Claritin (loratadine) 10 mg, Xyzal (levocetirizine) 5 mg or Allegra (fexofenadine) 180 mg daily as needed -Can also use Pataday or Zaditor eyedrops (can purchase over-the-counter) daily as needed -Consider allergy  injections to reduce lifetime symptoms and need for medications by teaching your immune system to become tolerant of the environmental allergens you are allergic to  Dry Skin History of chicken skin and dryness, particularly on legs. No current signs of eczema. -Use thick emolient creams to keep skin hydrated (vanicream, eucerin, cerave, aquaphor, etc)     Follow up : 6-8 weeks,  sooner if needed. It was a pleasure seeing you again in clinic today! Thank you for allowing me to participate in your care.  Reducing Pollen Exposure  The American Academy of Allergy , Asthma and Immunology suggests the following steps to reduce your exposure to pollen during allergy  seasons.    Do not hang sheets or clothing out to dry; pollen may collect on these items. Do not mow lawns or spend time around freshly cut grass; mowing stirs up pollen. Keep windows closed at night.  Keep car windows closed while driving. Minimize morning activities outdoors, a time when pollen counts are usually at their highest. Stay indoors as much as possible when pollen counts or humidity is high and on windy days when pollen tends to remain in the air longer. Use air conditioning when possible.  Many air conditioners have filters that trap the pollen spores. Use a HEPA room air filter to remove pollen form the indoor air you breathe. DUST MITE AVOIDANCE MEASURES:  There are three main measures that need and can be taken to avoid house dust mites:  Reduce accumulation of dust in general -reduce furniture, clothing, carpeting, books, stuffed animals, especially in bedroom  Separate yourself from the dust -use pillow and mattress encasements (can be found at stores such as [employer] or online) -avoid direct exposure to air condition flow -use a HEPA filter device, especially in the bedroom; you can also use a HEPA filter vacuum cleaner -wipe dust with a moist towel instead of a dry towel or broom when cleaning  Decrease mites and/or their secretions -wash clothing and linen and stuffed animals at highest temperature possible, at least every 2 weeks -stuffed animals can also be placed in a bag  and put in a freezer overnight  Despite the above measures, it is impossible to eliminate dust mites or their allergen completely from your home.  With the above measures the burden of mites in your  home can be diminished, with the goal of minimizing your allergic symptoms.  Success will be reached only when implementing and using all means together. Control of Dog or Cat Allergen  Avoidance is the best way to manage a dog or cat allergy . If you have a dog or cat and are allergic to dog or cats, consider removing the dog or cat from the home. If you have a dog or cat but don't want to find it a new home, or if your family wants a pet even though someone in the household is allergic, here are some strategies that may help keep symptoms at bay:  Keep the pet out of your bedroom and restrict it to only a few rooms. Be advised that keeping the dog or cat in only one room will not limit the allergens to that room. Don't pet, hug or kiss the dog or cat; if you do, wash your hands with soap and water. High-efficiency particulate air (HEPA) cleaners run continuously in a bedroom or living room can reduce allergen levels over time. Regular use of a high-efficiency vacuum cleaner or a central vacuum can reduce allergen levels. Giving your dog or cat a bath at least once a week can reduce airborne allergen.

## 2023-07-09 LAB — ANA W/REFLEX

## 2023-07-09 LAB — RHEUMATOID FACTOR: Rheumatoid fact SerPl-aCnc: <10 [IU]/mL (ref <14.0)

## 2023-07-10 LAB — ALLERGEN PROFILE, SHELLFISH
Clam IgE: <0.1 kU/L
F023-IgE Crab: <0.1 kU/L
F080-IgE Lobster: <0.1 kU/L
F290-IgE Oyster: <0.1 kU/L
Scallop IgE: <0.1 kU/L
Shrimp IgE: <0.1 kU/L

## 2023-11-18 ENCOUNTER — Other Ambulatory Visit: Payer: Self-pay | Admitting: Obstetrics and Gynecology

## 2023-11-18 DIAGNOSIS — Z3041 Encounter for surveillance of contraceptive pills: Secondary | ICD-10-CM

## 2023-11-20 ENCOUNTER — Telehealth: Payer: Self-pay

## 2023-11-20 DIAGNOSIS — Z3041 Encounter for surveillance of contraceptive pills: Secondary | ICD-10-CM

## 2023-11-20 MED ORDER — NORGESTIM-ETH ESTRAD TRIPHASIC 0.18/0.215/0.25 MG-35 MCG PO TABS: ORAL_TABLET | ORAL | 0 refills | Status: DC

## 2023-11-20 NOTE — Addendum Note (Signed)
 Addended by: Camila Maita on: 11/20/2023 10:24 AM   Modules accepted: Orders

## 2023-11-20 NOTE — Telephone Encounter (Signed)
 Pt left msg on triage asking for a refill of her Norgestimate-Ethinyl Estradiol Triphasic (TRI-ESTARYLLA ) 0.18/0.215/0.25 MG-35 MCG tablet. She is due for her annual. Sent front desk secure chat so they can schedule her. Once scheduled I will send one refill.

## 2023-11-24 ENCOUNTER — Other Ambulatory Visit: Payer: Self-pay | Admitting: Family Medicine

## 2023-11-24 DIAGNOSIS — L508 Other urticaria: Secondary | ICD-10-CM

## 2023-12-25 ENCOUNTER — Ambulatory Visit: Admitting: Obstetrics and Gynecology

## 2024-01-02 ENCOUNTER — Ambulatory Visit (INDEPENDENT_AMBULATORY_CARE_PROVIDER_SITE_OTHER): Admitting: Obstetrics and Gynecology

## 2024-01-02 ENCOUNTER — Encounter: Payer: Self-pay | Admitting: Obstetrics and Gynecology

## 2024-01-02 VITALS — BP 108/76 | HR 79 | Ht 66.0 in | Wt 184.0 lb

## 2024-01-02 DIAGNOSIS — Z01419 Encounter for gynecological examination (general) (routine) without abnormal findings: Secondary | ICD-10-CM | POA: Diagnosis not present

## 2024-01-02 DIAGNOSIS — A6000 Herpesviral infection of urogenital system, unspecified: Secondary | ICD-10-CM

## 2024-01-02 DIAGNOSIS — R030 Elevated blood-pressure reading, without diagnosis of hypertension: Secondary | ICD-10-CM | POA: Diagnosis not present

## 2024-01-02 DIAGNOSIS — Z3041 Encounter for surveillance of contraceptive pills: Secondary | ICD-10-CM | POA: Diagnosis not present

## 2024-01-02 MED ORDER — VALACYCLOVIR HCL 500 MG PO TABS: 500.0000 mg | ORAL_TABLET | Freq: Two times a day (BID) | ORAL | 1 refills | Status: AC

## 2024-01-02 MED ORDER — NORGESTIM-ETH ESTRAD TRIPHASIC 0.18/0.215/0.25 MG-35 MCG PO TABS: ORAL_TABLET | ORAL | 0 refills | Status: DC

## 2024-01-02 NOTE — Progress Notes (Signed)
   Subjective:     Kathy Moran is a 23 y.o. female here at [facility] for a routine exam.  Current complaints: None.  Personal and family health history reviewed: yes.  Do you have a primary care provider? Yes Do you feel safe at home? Yes  Flowsheet Row Office Visit from 01/02/2024 in [facility] [employer]  PHQ-2 Total Score 0    Health Maintenance Due  Topic Date Due   HPV VACCINES (1 - 3-dose series) Never done   HIV Screening  Never done   Meningococcal B Vaccine (1 of 2 - Standard) Never done   Hepatitis C Screening  Never done   Hepatitis B Vaccines (1 of 3 - 19+ 3-dose series) Never done   COVID-19 Vaccine (1 - 2024-25 season) Never done   CHLAMYDIA SCREENING  12/22/2023   INFLUENZA VACCINE  12/29/2023     Risk factors for chronic health problems: Smoking: Alchohol/how much: Pt BMI: Body mass index is 29.7 kg/m.   Gynecologic History Patient's last menstrual period was 12/21/2023 (exact date). Contraception: OCP (estrogen/progesterone) Last Pap: 12/22/22. Results were: normal Last mammogram: N/A. Results were: N/A  Obstetric History OB History  Gravida Para Term Preterm AB Living  0 0 0 0 0 0  SAB IAB Ectopic Multiple Live Births  0 0 0 0 0     The following portions of the patient's history were reviewed and updated as appropriate: allergies, current medications, past family history, past medical history, past social history, past surgical history, and problem list.  Review of Systems Pertinent items noted in HPI and remainder of comprehensive ROS otherwise negative.    Objective:   Today's Vitals   01/02/24 0804 01/02/24 0811  BP: (!) 138/94 108/76  Pulse: (!) 106 79  Weight: 83.5 kg   Height: 5' 6 (1.676 m)    Body mass index is 29.7 kg/m.  VS reviewed, nursing note reviewed,  Constitutional: well developed, well nourished, no distress HEENT: normocephalic, thyroid without enlargement or  mass HEART: RRR, no murmurs rubs/gallops RESP: clear and equal to auscultation bilaterally in all lobes  Breast Exam: exam performed: right breast normal without mass, skin or nipple changes or axillary nodes, left breast normal without mass, skin or nipple changes or axillary nodes Abdomen: soft Neuro: alert and oriented x 3 Skin: warm, dry Psych: affect normal Pelvic exam: Deferred Bimanual exam: Cervix 0/long/high, firm, anterior, neg CMT, uterus nontender, nonenlarged, adnexa without tenderness, enlargement, or mass        Assessment/Plan:  1. Well woman exam (Primary) Follow up in 1 year for AEX.  2. Genital herpes simplex, unspecified site Valtrex  500 mg BID x 5 days as needed for outbreak.  3. Encounter for surveillance of contraceptive pills - Norgestimate-Ethinyl Estradiol Triphasic (TRI-ESTARYLLA ) 0.18/0.215/0.25 MG-35 MCG tablet; TAKE 1 TAB BY MOUTH ONCE A DAY  Dispense: 84 tablet; Refill: 0  4. Elevated BP without diagnosis of hypertension Elevated BP with first check. Recheck within normal range.   Return in about 1 year (around 01/01/2025) for AEX.   Derrek JINNY Freund, NP Student 9:06 AM

## 2024-02-26 ENCOUNTER — Ambulatory Visit
Admission: EM | Admit: 2024-02-26 | Discharge: 2024-02-26 | Disposition: A | Discharge status: Home / Self Care | Attending: Emergency Medicine | Admitting: Emergency Medicine

## 2024-02-26 DIAGNOSIS — J029 Acute pharyngitis, unspecified: Secondary | ICD-10-CM

## 2024-02-26 DIAGNOSIS — J069 Acute upper respiratory infection, unspecified: Secondary | ICD-10-CM

## 2024-02-26 LAB — POC SARS CORONAVIRUS 2 AG -  ED: SARS Coronavirus 2 Ag: NEGATIVE

## 2024-02-26 LAB — POCT RAPID STREP A (OFFICE): Rapid Strep A Screen: NEGATIVE

## 2024-02-26 MED ORDER — PROMETHAZINE-DM 6.25-15 MG/5ML PO SYRP: 5.0000 mL | ORAL_SOLUTION | Freq: Four times a day (QID) | ORAL | 0 refills | Status: AC | PRN

## 2024-02-26 MED ORDER — GUAIFENESIN ER 600 MG PO TB12: 1200.0000 mg | ORAL_TABLET | Freq: Two times a day (BID) | ORAL | 0 refills | Status: AC

## 2024-02-26 MED ORDER — IBUPROFEN 800 MG PO TABS: 800.0000 mg | ORAL_TABLET | Freq: Three times a day (TID) | ORAL | 0 refills | Status: AC

## 2024-02-26 NOTE — Discharge Instructions (Addendum)
 Covid and strep testing negative today You likely have a viral illness  I recommend taking plain guaifenesin (Mucinex) twice daily for congestion in the sinuses and chest.  This only works if you are very hydrated so drink lots of water.  The promethazine DM cough syrup can be used up to 4 times daily. If this medication makes you drowsy, take only once before bed.  Ibuprofen can be used every 6 hours for sore throat, aches, fever  Please allow about 4 or 5 more days for symptoms to improve.  If after this timeframe symptoms persist or worsen, please return

## 2024-02-26 NOTE — ED Provider Notes (Signed)
 [facility]    CSN: [id number] Arrival date & time: 02/26/24  1756      History   Chief Complaint Chief Complaint  Patient presents with   Facial Pain    HPI Kathy Moran is a 23 y.o. female.  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   Past Medical History:  Diagnosis Date   Anxiety    Undiagnosed   Genital herpes 2021   HSV 1 on culture   Hypertension March 2022   Lumbar herniated disc     Patient Active Problem List   Diagnosis Date Noted   Elevated BP without diagnosis of hypertension 01/02/2024   Seasonal and perennial allergic rhinitis 07/07/2023   ASCUS of cervix with negative high risk HPV 12/22/2022   Herpes simplex vulvovaginitis 12/22/2022    Past Surgical History:  Procedure Laterality Date   MOUTH SURGERY     NO PAST SURGERIES      OB History     Gravida  0   Para  0   Term  0   Preterm  0   AB  0   Living  0      SAB  0   IAB  0   Ectopic  0   Multiple  0   Live Births  0            Home Medications    Prior to Admission medications   Medication Sig Start Date End Date Taking? Authorizing Provider  guaiFENesin (MUCINEX) 600 MG 12 hr tablet Take 2 tablets (1,200 mg total) by mouth 2 (two) times daily. 02/26/24  Yes Sandara Tyree, Asberry, PA-C  ibuprofen (ADVIL) 800 MG tablet Take 1 tablet (800 mg total) by mouth 3 (three) times daily. 02/26/24  Yes Dymir Neeson, Asberry, PA-C  promethazine-dextromethorphan (PROMETHAZINE-DM) 6.25-15 MG/5ML syrup Take 5 mLs by mouth 4 (four) times daily as needed for cough. 02/26/24  Yes Brodey Bonn, Asberry, PA-C  EPINEPHrine  (EPIPEN  2-PAK) 0.3 mg/0.3 mL IJ SOAJ injection Inject 0.3 mg into the muscle as needed for anaphylaxis. 06/16/23   Marinda Rocky SAILOR, MD  montelukast  (SINGULAIR ) 10 MG tablet TAKE 1 TABLET BY MOUTH EVERYDAY AT BEDTIME 11/24/23   Colette Torrence GRADE, MD  Norgestimate-Ethinyl Estradiol Triphasic (TRI-ESTARYLLA ) 0.18/0.215/0.25 MG-35 MCG tablet TAKE 1 TAB BY MOUTH ONCE A DAY 01/02/24   Rasch, Delon I, NP  valACYclovir  (VALTREX ) 500 MG tablet Take 1 tablet (500 mg total) by mouth 2 (two) times daily. 01/02/24   Rasch, Delon FERNS, NP    Family History Family History  Problem Relation Age of Onset   Breast cancer Mother 75   Cancer Mother    Diabetes Mother    Hypertension Mother    Healthy Father    Diabetes Maternal Grandmother     Social History Social History   Tobacco Use   Smoking status: Former    Current packs/day: 0.00    Types: E-cigarettes, Cigarettes    Quit date: 03/31/2022    Years since quitting: 1.9    Passive exposure: Past   Smokeless tobacco: Never  Vaping Use   Vaping status: Some Days  Substance Use Topics   Alcohol use: Yes    Alcohol/week: 1.0 - 2.0 standard drink of alcohol    Types: 1 - 2 Glasses of wine per week    Comment: occ  Drug use: Never     Allergies   Shrimp [shellfish allergy ]   Review of Systems Review of Systems  As per HPI  Physical Exam Triage Vital Signs ED Triage Vitals [02/26/24 1830]  Encounter Vitals Group     BP (!) 148/113     Girls Systolic BP Percentile      Girls Diastolic BP Percentile      Boys Systolic BP Percentile      Boys Diastolic BP Percentile      Pulse Rate 91     Resp 19     Temp 98.4 F (36.9 C)     Temp src      SpO2 98 %     Weight      Height      Head Circumference      Peak Flow      Pain Score 0     Pain Loc      Pain Education      Exclude from Growth Chart    No data found.  Updated Vital Signs BP (!) 148/113   Pulse 91   Temp 98.4 F (36.9 C)   Resp 19   LMP 02/14/2024   SpO2 98%   Physical Exam Vitals and nursing note reviewed.  Constitutional:      General: She is not in acute distress.    Appearance: She is not ill-appearing.  HENT:     Right Ear: Tympanic membrane and ear canal normal.     Left  Ear: Tympanic membrane and ear canal normal.     Nose: No rhinorrhea.     Mouth/Throat:     Mouth: Mucous membranes are moist.     Pharynx: Oropharynx is clear. No oropharyngeal exudate or posterior oropharyngeal erythema.  Eyes:     Conjunctiva/sclera: Conjunctivae normal.  Cardiovascular:     Rate and Rhythm: Normal rate and regular rhythm.     Pulses: Normal pulses.     Heart sounds: Normal heart sounds.  Pulmonary:     Effort: Pulmonary effort is normal.     Breath sounds: Normal breath sounds.  Abdominal:     Palpations: Abdomen is soft.     Tenderness: There is no abdominal tenderness.  Musculoskeletal:     Cervical back: Normal range of motion.  Lymphadenopathy:     Cervical: No cervical adenopathy.  Skin:    General: Skin is warm and dry.  Neurological:     Mental Status: She is alert and oriented to person, place, and time.      UC Treatments / Results  Labs (all labs ordered are listed, but only abnormal results are displayed) Labs Reviewed  POCT RAPID STREP A (OFFICE) - Normal  POC SARS CORONAVIRUS 2 AG -  ED    EKG  Radiology No results found.  Procedures Procedures   Medications Ordered in UC Medications - No data to display  Initial Impression / Assessment and Plan / UC Course  I have reviewed the triage vital signs and the nursing notes.  Pertinent labs & imaging results that were available during my care of the patient were reviewed by me and considered in my medical decision making (see chart for details).  Afebrile in clinic Well appearing, clear lungs Rapid covid negative  Rapid strep negative. Defer culture given no tonsillar hypertrophy, exudate, erythema, or LAD. Viral etiology, supportive care, OTC options discussed Likely prognosis and return precautions Agrees to plan, all questions answered   Final Clinical Impressions(s) /  UC Diagnoses   Final diagnoses:  Sore throat  Viral URI with cough     Discharge Instructions       Covid and strep testing negative today You likely have a viral illness  I recommend taking plain guaifenesin (Mucinex) twice daily for congestion in the sinuses and chest.  This only works if you are very hydrated so drink lots of water.  The promethazine DM cough syrup can be used up to 4 times daily. If this medication makes you drowsy, take only once before bed.  Ibuprofen can be used every 6 hours for sore throat, aches, fever  Please allow about 4 or 5 more days for symptoms to improve.  If after this timeframe symptoms persist or worsen, please return     ED Prescriptions     Medication Sig Dispense Auth. Provider   ibuprofen (ADVIL) 800 MG tablet Take 1 tablet (800 mg total) by mouth 3 (three) times daily. 21 tablet Kairee Isa, PA-C   promethazine-dextromethorphan (PROMETHAZINE-DM) 6.25-15 MG/5ML syrup Take 5 mLs by mouth 4 (four) times daily as needed for cough. 240 mL Sarye Kath, PA-C   guaiFENesin (MUCINEX) 600 MG 12 hr tablet Take 2 tablets (1,200 mg total) by mouth 2 (two) times daily. 30 tablet Allie Gerhold, [place]berry, PA-C      PDMP not reviewed this encounter.   Janiah Devinney, [place]berry, PA-C 02/26/24 2019

## 2024-02-26 NOTE — ED Triage Notes (Addendum)
 [facility] with [facility] sore throat, nasal congestion, stuffy nose, and facial pressure fatigue since Friday. Pt is concerned about a sinus infection. 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 Pt has taken dayquill.

## 2024-05-08 ENCOUNTER — Telehealth: Payer: Self-pay | Admitting: *Deleted

## 2024-05-08 ENCOUNTER — Other Ambulatory Visit: Payer: Self-pay

## 2024-05-08 DIAGNOSIS — Z3041 Encounter for surveillance of contraceptive pills: Secondary | ICD-10-CM

## 2024-05-08 MED ORDER — NORGESTIM-ETH ESTRAD TRIPHASIC 0.18/0.215/0.25 MG-35 MCG PO TABS: ORAL_TABLET | ORAL | 2 refills | Status: AC

## 2024-05-08 NOTE — Telephone Encounter (Signed)
 Patient needs a refill on birth control if it has not been refilled already. Patient only has 3 pills left as of today.

## 2024-05-22 ENCOUNTER — Other Ambulatory Visit: Payer: Self-pay | Admitting: Family Medicine

## 2024-05-22 DIAGNOSIS — L508 Other urticaria: Secondary | ICD-10-CM
